# Patient Record
Sex: Male | Born: 1965
Health system: Southern US, Community
[De-identification: ages and names within clinical notes are randomized; demographics above are authoritative.]

## PROBLEM LIST (undated history)

## (undated) DIAGNOSIS — G4730 Sleep apnea, unspecified: Secondary | ICD-10-CM

## (undated) DIAGNOSIS — R519 Headache, unspecified: Secondary | ICD-10-CM

## (undated) HISTORY — PX: CARPAL TUNNEL RELEASE: SHX101

---

## 2020-05-31 DIAGNOSIS — H52223 Regular astigmatism, bilateral: Secondary | ICD-10-CM | POA: Diagnosis not present

## 2020-07-23 DIAGNOSIS — Z20822 Contact with and (suspected) exposure to covid-19: Secondary | ICD-10-CM | POA: Diagnosis not present

## 2020-08-14 ENCOUNTER — Emergency Department (HOSPITAL_BASED_OUTPATIENT_CLINIC_OR_DEPARTMENT_OTHER)
Admission: EM | Admit: 2020-08-14 | Discharge: 2020-08-15 | Disposition: A | Discharge status: Home / Self Care | Payer: BC Managed Care – PPO | Attending: Emergency Medicine | Admitting: Emergency Medicine

## 2020-08-14 ENCOUNTER — Emergency Department (HOSPITAL_BASED_OUTPATIENT_CLINIC_OR_DEPARTMENT_OTHER): Payer: BC Managed Care – PPO

## 2020-08-14 ENCOUNTER — Other Ambulatory Visit: Payer: Self-pay

## 2020-08-14 ENCOUNTER — Encounter (HOSPITAL_BASED_OUTPATIENT_CLINIC_OR_DEPARTMENT_OTHER): Payer: Self-pay | Admitting: *Deleted

## 2020-08-14 DIAGNOSIS — Y9301 Activity, walking, marching and hiking: Secondary | ICD-10-CM | POA: Diagnosis not present

## 2020-08-14 DIAGNOSIS — W010XXA Fall on same level from slipping, tripping and stumbling without subsequent striking against object, initial encounter: Secondary | ICD-10-CM | POA: Diagnosis not present

## 2020-08-14 DIAGNOSIS — S20212A Contusion of left front wall of thorax, initial encounter: Secondary | ICD-10-CM | POA: Insufficient documentation

## 2020-08-14 DIAGNOSIS — S299XXA Unspecified injury of thorax, initial encounter: Secondary | ICD-10-CM | POA: Diagnosis not present

## 2020-08-14 NOTE — ED Triage Notes (Signed)
Fall  From standing x 2 days ago , c/o chest wall pain  Increase pain with movt .

## 2020-08-15 MED ORDER — IBUPROFEN 800 MG PO TABS: 800.0000 mg | ORAL_TABLET | Freq: Four times a day (QID) | ORAL | 0 refills | Status: DC | PRN

## 2020-08-15 MED ORDER — HYDROCODONE-ACETAMINOPHEN 5-325 MG PO TABS: 1.0000 | ORAL_TABLET | ORAL | 0 refills | Status: DC | PRN

## 2020-08-15 NOTE — ED Notes (Signed)
Patient verbalizes understanding of discharge instructions. Opportunity for questioning and answers were provided. Armband removed by staff, pt discharged from ED ambulatory to home.  

## 2020-08-15 NOTE — ED Provider Notes (Signed)
[facility] EMERGENCY DEPARTMENT Provider Note   CSN: [id number] Arrival date & time: 08/14/20  2013     History Chief Complaint  Patient presents with  . Fall    William Rose is a 55 y.o. male.  Patient presents to the emergency department for evaluation of anterior and left-sided chest injury.  Patient fell 2 days ago.  He reports that he tripped over an object while walking and fell to the ground.  Did not hit his head or lose consciousness.  No syncope.  Denies neck and back pain.  He did hit his right knee but is able to walk and bend it without difficulty.  No abdominal pain.  Patient reports constant pain that worsens with bending, moving or taking deep breaths.        History reviewed. No pertinent past medical history.  There are no problems to display for this patient.   Past Surgical History:  Procedure Laterality Date  . CARPAL TUNNEL RELEASE         No family history on file.  Social History   Tobacco Use  . Smoking status: Never Smoker  Substance Use Topics  . Alcohol use: Not Currently  . Drug use: Not Currently    Home Medications Prior to Admission medications   Medication Sig Start Date End Date Taking? Authorizing Provider  HYDROcodone-acetaminophen (NORCO/VICODIN) 5-325 MG tablet Take 1 tablet by mouth every 4 (four) hours as needed for severe pain. 08/15/20  Yes Mylei Brackeen, Canary Brim, MD  ibuprofen (ADVIL) 800 MG tablet Take 1 tablet (800 mg total) by mouth every 6 (six) hours as needed for moderate pain. 08/15/20  Yes Eithan Beagle, Canary Brim, MD    Allergies    Patient has no known allergies.  Review of Systems   Review of Systems  Cardiovascular: Positive for chest pain.  All other systems reviewed and are negative.   Physical Exam Updated Vital Signs BP 125/85 (BP Location: Right Arm)   Pulse 72   Temp 98.5 F (36.9 C)   Resp 20   Ht 5\' 6"  (1.676 m)   Wt (!) 138 kg   SpO2 98%   BMI 49.10 kg/m   Physical  Exam Vitals and nursing note reviewed.  Constitutional:      General: He is not in acute distress.    Appearance: Normal appearance. He is well-developed and well-nourished.  HENT:     Head: Normocephalic and atraumatic.     Right Ear: Hearing normal.     Left Ear: Hearing normal.     Nose: Nose normal.     Mouth/Throat:     Mouth: Oropharynx is clear and moist and mucous membranes are normal.  Eyes:     Extraocular Movements: EOM normal.     Conjunctiva/sclera: Conjunctivae normal.     Pupils: Pupils are equal, round, and reactive to light.  Cardiovascular:     Rate and Rhythm: Regular rhythm.     Heart sounds: S1 normal and S2 normal. No murmur heard. No friction rub. No gallop.   Pulmonary:     Effort: Pulmonary effort is normal. No respiratory distress.     Breath sounds: Normal breath sounds.  Chest:     Chest wall: Tenderness present.    Abdominal:     General: Bowel sounds are normal.     Palpations: Abdomen is soft. There is no hepatosplenomegaly.     Tenderness: There is no abdominal tenderness. There is no guarding or rebound. Negative signs  include Murphy's sign and McBurney's sign.     Hernia: No hernia is present.  Musculoskeletal:        General: Normal range of motion.     Cervical back: Normal range of motion and neck supple.     Right knee: No swelling, deformity, effusion, erythema or ecchymosis. Normal range of motion. Tenderness present.  Skin:    General: Skin is warm, dry and intact.     Findings: No rash.     Nails: There is no cyanosis.  Neurological:     Mental Status: He is alert and oriented to person, place, and time.     GCS: GCS eye subscore is 4. GCS verbal subscore is 5. GCS motor subscore is 6.     Cranial Nerves: No cranial nerve deficit.     Sensory: No sensory deficit.     Coordination: Coordination normal.     Deep Tendon Reflexes: Strength normal.  Psychiatric:        Mood and Affect: Mood and affect normal.        Speech: Speech  normal.        Behavior: Behavior normal.        Thought Content: Thought content normal.     ED Results / Procedures / Treatments   Labs (all labs ordered are listed, but only abnormal results are displayed) Labs Reviewed - No data to display  EKG None  Radiology DG Chest 2 View  Result Date: 08/14/2020 CLINICAL DATA:  Status post fall with chest wall pain. EXAM: CHEST - 2 VIEW COMPARISON:  None. FINDINGS: Decreased lung volumes are seen which is likely secondary to the degree of patient inspiration. Very mild atelectasis is noted within the bilateral lung bases. There is no evidence of a pleural effusion or pneumothorax. The heart size and mediastinal contours are within normal limits. The visualized skeletal structures are unremarkable. IMPRESSION: 1. Low lung volumes with very mild bibasilar atelectasis. Electronically Signed   By: Virgina Norfolk M.D.   On: 08/14/2020 20:46    Procedures Procedures (including critical care time)  Medications Ordered in ED Medications - No data to display  ED Course  I have reviewed the triage vital signs and the nursing notes.  Pertinent labs & imaging results that were available during my care of the patient were reviewed by me and considered in my medical decision making (see chart for details).    MDM Rules/Calculators/A&P                          Left anterior chest discomfort and tenderness after a fall.  Deep palpation in left upper quadrant reveals no abdominal tenderness.  No head injury, axial skeleton injury.  Patient did hit the right knee but examination is unremarkable.  No deformity, no swelling, normal range of motion.  No ligamentous instability.  Patella tendon intact.  X-ray does not show evidence of rib fracture or pulmonary contusion.  Will provide analgesia, given return precautions.  Final Clinical Impression(s) / ED Diagnoses Final diagnoses:  Chest wall contusion, left, initial encounter    Rx / DC Orders ED  Discharge Orders         Ordered    HYDROcodone-acetaminophen (NORCO/VICODIN) 5-325 MG tablet  Every 4 hours PRN        08/15/20 0030    ibuprofen (ADVIL) 800 MG tablet  Every 6 hours PRN        08/15/20 0030  Gilda Crease, MD 08/15/20 0030

## 2020-09-19 ENCOUNTER — Other Ambulatory Visit: Payer: Self-pay

## 2020-09-19 ENCOUNTER — Ambulatory Visit (INDEPENDENT_AMBULATORY_CARE_PROVIDER_SITE_OTHER): Payer: BC Managed Care – PPO | Admitting: Family Medicine

## 2020-09-19 ENCOUNTER — Encounter: Payer: Self-pay | Admitting: Family Medicine

## 2020-09-19 VITALS — BP 130/75 | HR 73 | Temp 98.6°F | Ht 66.54 in | Wt 301.5 lb

## 2020-09-19 DIAGNOSIS — Z13220 Encounter for screening for lipoid disorders: Secondary | ICD-10-CM

## 2020-09-19 DIAGNOSIS — S20219A Contusion of unspecified front wall of thorax, initial encounter: Secondary | ICD-10-CM | POA: Insufficient documentation

## 2020-09-19 DIAGNOSIS — S20219D Contusion of unspecified front wall of thorax, subsequent encounter: Secondary | ICD-10-CM

## 2020-09-19 DIAGNOSIS — Z0000 Encounter for general adult medical examination without abnormal findings: Secondary | ICD-10-CM | POA: Diagnosis not present

## 2020-09-19 NOTE — Progress Notes (Signed)
William Rose - 55 y.o. male MRN [id number]  Date of birth: 1965/12/20  Subjective Chief Complaint  Patient presents with  . Establish Care   Due to language barrier, an interpreter was present during the history-taking and subsequent discussion (and for part of the physical exam) with this patient.   HPI William Rose is a 55 year old male here today for initial visit to establish care.  He and his wife recently moved to this area from [place].  He has been in pretty good health as far as he knows.  He did have a fall a few weeks ago resulting in chest wall contusion.  This has improved.  He reports having colonoscopy previously in [place].  He plans to schedule a physical and have updated labs and vaccinations  ROS:  A comprehensive ROS was completed and negative except as noted per HPI  No Known Allergies  History reviewed. No pertinent past medical history.  Past Surgical History:  Procedure Laterality Date  . CARPAL TUNNEL RELEASE      Social History   Socioeconomic History  . Marital status: Married    Spouse name: Not on file  . Number of children: Not on file  . Years of education: Not on file  . Highest education level: Not on file  Occupational History    Employer: [employer]  Tobacco Use  . Smoking status: Never Smoker  . Smokeless tobacco: Never Used  Vaping Use  . Vaping Use: Never used  Substance and Sexual Activity  . Alcohol use: Not Currently  . Drug use: Not Currently  . Sexual activity: Yes    Partners: Female  Other Topics Concern  . Not on file  Social History Narrative  . Not on file   Social Determinants of Health   Financial Resource Strain: Not on file  Food Insecurity: Not on file  Transportation Needs: Not on file  Physical Activity: Not on file  Stress: Not on file  Social Connections: Not on file    History reviewed. No pertinent family history.  Health Maintenance  Topic Date Due  . Hepatitis C Screening  Never done   . HIV Screening  Never done  . TETANUS/TDAP  Never done  . COLONOSCOPY (Pts 45-21yrs Insurance coverage will need to be confirmed)  Never done  . INFLUENZA VACCINE  11/06/2020 (Originally 03/09/2020)  . COVID-19 Vaccine  Completed     ----------------------------------------------------------------------------------------------------------------------------------------------------------------------------------------------------------------- Physical Exam BP 130/75 (BP Location: Left Arm, Patient Position: Sitting, Cuff Size: Large)   Pulse 73   Temp 98.6 F (37 C) (Oral)   Ht 5' 6.54" (1.69 m)   Wt (!) 301 lb 8 oz (136.8 kg)   SpO2 99%   BMI 47.88 kg/m   Physical Exam Constitutional:      Appearance: Normal appearance.  Neurological:     General: No focal deficit present.     Mental Status: He is alert.  Psychiatric:        Mood and Affect: Mood normal.        Behavior: Behavior normal.     ------------------------------------------------------------------------------------------------------------------------------------------------------------------------------------------------------------------- Assessment and Plan  Chest wall contusion Related to fall in January.  Still has some mild tenderness however has improved significantly since initial injury.  Labs ordered for upcoming annual exam.  He had colonoscopy in [place], he will try to get a copy of these records.  No orders of the defined types were placed in this encounter.   No follow-ups on file.    This  visit occurred during the SARS-CoV-2 public health emergency.  Safety protocols were in place, including screening questions prior to the visit, additional usage of staff PPE, and extensive cleaning of exam room while observing appropriate contact time as indicated for disinfecting solutions.

## 2020-09-19 NOTE — Patient Instructions (Addendum)
Very nice to meet you today! Please have labs completed prior to annual exam.

## 2020-09-19 NOTE — Assessment & Plan Note (Signed)
Related to fall in January.  Still has some mild tenderness however has improved significantly since initial injury.

## 2020-09-22 ENCOUNTER — Encounter: Payer: Self-pay | Admitting: Family Medicine

## 2020-09-26 DIAGNOSIS — Z0000 Encounter for general adult medical examination without abnormal findings: Secondary | ICD-10-CM | POA: Diagnosis not present

## 2020-09-26 DIAGNOSIS — Z13220 Encounter for screening for lipoid disorders: Secondary | ICD-10-CM | POA: Diagnosis not present

## 2020-09-27 LAB — COMPLETE METABOLIC PANEL WITH GFR
AG Ratio: 1.8 (calc) (ref 1.0–2.5)
ALT: 19 U/L (ref 9–46)
AST: 15 U/L (ref 10–35)
Albumin: 4.4 g/dL (ref 3.6–5.1)
Alkaline phosphatase (APISO): 59 U/L (ref 35–144)
BUN: 17 mg/dL (ref 7–25)
CO2: 27 mmol/L (ref 20–32)
Calcium: 9 mg/dL (ref 8.6–10.3)
Chloride: 104 mmol/L (ref 98–110)
Creat: 0.75 mg/dL (ref 0.70–1.33)
GFR, Est African American: 121 mL/min/{1.73_m2} (ref >=60)
GFR, Est Non African American: 105 mL/min/{1.73_m2} (ref >=60)
Globulin: 2.4 g/dL (calc) (ref 1.9–3.7)
Glucose, Bld: 109 mg/dL — ABNORMAL HIGH (ref 65–99)
Potassium: 4.5 mmol/L (ref 3.5–5.3)
Sodium: 139 mmol/L (ref 135–146)
Total Bilirubin: 0.7 mg/dL (ref 0.2–1.2)
Total Protein: 6.8 g/dL (ref 6.1–8.1)

## 2020-09-27 LAB — LIPID PANEL
Cholesterol: 198 mg/dL (ref <200)
HDL: 46 mg/dL (ref >=40)
LDL Cholesterol (Calc): 127 mg/dL (calc) — ABNORMAL HIGH
Non-HDL Cholesterol (Calc): 152 mg/dL (calc) — ABNORMAL HIGH (ref <130)
Total CHOL/HDL Ratio: 4.3 (calc) (ref <5.0)
Triglycerides: 131 mg/dL (ref <150)

## 2020-09-27 LAB — CBC
HCT: 43.4 % (ref 38.5–50.0)
Hemoglobin: 15 g/dL (ref 13.2–17.1)
MCH: 30.2 pg (ref 27.0–33.0)
MCHC: 34.6 g/dL (ref 32.0–36.0)
MCV: 87.5 fL (ref 80.0–100.0)
MPV: 10.5 fL (ref 7.5–12.5)
Platelets: 324 10*3/uL (ref 140–400)
RBC: 4.96 10*6/uL (ref 4.20–5.80)
RDW: 13.1 % (ref 11.0–15.0)
WBC: 8 10*3/uL (ref 3.8–10.8)

## 2020-09-27 LAB — TSH: TSH: 1.67 mIU/L (ref 0.40–4.50)

## 2020-10-10 ENCOUNTER — Encounter: Payer: Self-pay | Admitting: Family Medicine

## 2020-10-10 ENCOUNTER — Ambulatory Visit (INDEPENDENT_AMBULATORY_CARE_PROVIDER_SITE_OTHER): Payer: BC Managed Care – PPO | Admitting: Family Medicine

## 2020-10-10 ENCOUNTER — Other Ambulatory Visit: Payer: Self-pay

## 2020-10-10 VITALS — BP 141/88 | HR 78 | Temp 98.0°F | Ht 66.0 in | Wt 303.1 lb

## 2020-10-10 DIAGNOSIS — Z0000 Encounter for general adult medical examination without abnormal findings: Secondary | ICD-10-CM

## 2020-10-10 DIAGNOSIS — E7800 Pure hypercholesterolemia, unspecified: Secondary | ICD-10-CM

## 2020-10-10 DIAGNOSIS — Z23 Encounter for immunization: Secondary | ICD-10-CM

## 2020-10-10 DIAGNOSIS — E785 Hyperlipidemia, unspecified: Secondary | ICD-10-CM | POA: Insufficient documentation

## 2020-10-10 NOTE — Progress Notes (Signed)
William Rose - 55 y.o. male MRN [id number]  Date of birth: Jul 11, 1966  Subjective Chief Complaint  Patient presents with  . Annual Exam    HPI William Rose is a 55 y.o. male here today for annual exam.  He is still having mild pain from fall last month.  He had labs completed prior to visit today which I reviewed with him.   His glucose was mildly elevated and his LDL cholesterol is elevated. He is not on any long term medications.   He had colonoscopy in [place] in 2019 that was normal.  He was advised to have repeat in 10 years.   He does not exercise very often and feels that his diet could be improved.    He needs update Tdap vaccine.   Review of Systems  Constitutional: Negative for chills, fever, malaise/fatigue and weight loss.  HENT: Negative for congestion, ear pain and sore throat.   Eyes: Negative for blurred vision, double vision and pain.  Respiratory: Negative for cough and shortness of breath.   Cardiovascular: Negative for chest pain and palpitations.  Gastrointestinal: Negative for abdominal pain, blood in stool, constipation, heartburn and nausea.  Genitourinary: Negative for dysuria and urgency.  Musculoskeletal: Negative for joint pain and myalgias.  Neurological: Negative for dizziness and headaches.  Endo/Heme/Allergies: Does not bruise/bleed easily.  Psychiatric/Behavioral: Negative for depression. The patient is not nervous/anxious and does not have insomnia.     No Known Allergies  History reviewed. No pertinent past medical history.  Past Surgical History:  Procedure Laterality Date  . CARPAL TUNNEL RELEASE      Social History   Socioeconomic History  . Marital status: Married    Spouse name: Not on file  . Number of children: Not on file  . Years of education: Not on file  . Highest education level: Not on file  Occupational History    Employer: [employer]  Tobacco Use  . Smoking status: Former Smoker    Packs/day: 1.00     Years: 30.00    Pack years: 30.00    Types: Cigarettes    Quit date: 08/09/2017    Years since quitting: 3.1  . Smokeless tobacco: Never Used  Vaping Use  . Vaping Use: Never used  Substance and Sexual Activity  . Alcohol use: Not Currently  . Drug use: Never  . Sexual activity: Yes    Partners: Female  Other Topics Concern  . Not on file  Social History Narrative  . Not on file   Social Determinants of Health   Financial Resource Strain: Not on file  Food Insecurity: Not on file  Transportation Needs: Not on file  Physical Activity: Not on file  Stress: Not on file  Social Connections: Not on file    History reviewed. No pertinent family history.  Health Maintenance  Topic Date Due  . Hepatitis C Screening  Never done  . HIV Screening  Never done  . COLONOSCOPY (Pts 45-19yrs Insurance coverage will need to be confirmed)  Never done  . INFLUENZA VACCINE  11/06/2020 (Originally 03/09/2020)  . TETANUS/TDAP  10/11/2030  . COVID-19 Vaccine  Completed  . HPV VACCINES  Aged Out     ----------------------------------------------------------------------------------------------------------------------------------------------------------------------------------------------------------------- Physical Exam BP (!) 141/88 (BP Location: Left Arm, Patient Position: Sitting, Cuff Size: Large)   Pulse 78   Temp 98 F (36.7 C)   Ht 5\' 6"  (1.676 m)   Wt (!) 303 lb 1.6 oz (137.5 kg)   SpO2 99%  BMI 48.92 kg/m   Physical Exam Constitutional:      General: He is not in acute distress.    Appearance: He is well-nourished.  HENT:     Head: Normocephalic and atraumatic.     Right Ear: Tympanic membrane and external ear normal.     Left Ear: Tympanic membrane and external ear normal.     Mouth/Throat:     Mouth: Oropharynx is clear and moist. Mucous membranes are moist.  Eyes:     General: No scleral icterus. Neck:     Thyroid: No thyromegaly.  Cardiovascular:     Rate and  Rhythm: Normal rate and regular rhythm.     Pulses: Intact distal pulses.     Heart sounds: Normal heart sounds.  Pulmonary:     Effort: Pulmonary effort is normal.     Breath sounds: Normal breath sounds.  Abdominal:     General: Bowel sounds are normal. There is no distension.     Palpations: Abdomen is soft.     Tenderness: There is no abdominal tenderness. There is no guarding.  Musculoskeletal:        General: No edema.     Cervical back: Normal range of motion.  Lymphadenopathy:     Cervical: No cervical adenopathy.  Skin:    General: Skin is warm and dry.     Findings: No rash.  Neurological:     Mental Status: He is alert and oriented to person, place, and time.     Cranial Nerves: No cranial nerve deficit.     Motor: No abnormal muscle tone.  Psychiatric:        Mood and Affect: Mood and affect and mood normal.        Behavior: Behavior normal.     ------------------------------------------------------------------------------------------------------------------------------------------------------------------------------------------------------------------- Assessment and Plan  Well adult exam Well adult Recent labs reviewed.  Dietary recommendations discussed and handout given.  He is interested in pursuing weight loss surgery, information given on this.  Immunizations:  Tdap Screenings: UTD Anticipatory guidance/Risk factor reduction:  Recommendations per AVS.    No orders of the defined types were placed in this encounter.   Return in about 6 months (around 04/12/2021) for HLD.    This visit occurred during the SARS-CoV-2 public health emergency.  Safety protocols were in place, including screening questions prior to the visit, additional usage of staff PPE, and extensive cleaning of exam room while observing appropriate contact time as indicated for disinfecting solutions.

## 2020-10-10 NOTE — Patient Instructions (Signed)
[facility] Location [address] [place], [place] [address] Business Hours Monday-Friday 8:30am - 5:00pm Contact Office: [phone] Fax: [phone]    [facility] [place] Phone: [phone]  F: [phone] [address] [place], [place] [address]   Dieta para el sndrome metablico Diet for Metabolic Syndrome El sndrome metablico es un trastorno que incluye tres o ms de las siguientes afecciones:  Obesidad abdominal, que se observa en una circunferencia grande de la cintura.  Exceso de azcar (glucosa) en la sangre.  Presin arterial alta (hipertensin arterial).  Una cantidad ms elevada de lo normal de grasa (lpidos) en [employer].  Un nivel por debajo de lo normal de colesterol "bueno" (HDL). Llevar un estilo de Ander Purpura y seguir un plan de dieta saludable puede ayudarlo a manejar su afeccin. Estos cambios tambin pueden ayudar a IT sales professional de afecciones asociadas con el sndrome metablico, como diabetes, enfermedad cardaca y accidente cerebrovascular. Seguir una dieta cardiosaludable puede ayudarlo a disminuir el riesgo de enfermedad cardaca y mejorar el sndrome metablico. Neomia Dear dieta cardiosaludable incluye protenas magras, grasas saludables, frutos secos y muchas frutas y verduras. Junto con la actividad fsica regular, una dieta saludable:  Ayuda a mejorar la forma en que el organismo [place] la [place].  Ayuda a perder peso. Un objetivo comn de las personas con esta afeccin es bajar por lo menos entre el 7 y el 10% o ms del peso inicial. ? Por ejemplo, una persona que comienza con 300lb [phone]) y pierde 30lb (13.6kg) ha perdido el 10% de su peso inicial. Consejos para seguir Consulting civil engineer las etiquetas de los alimentos Verifique la cantidad de sal (sodio) por porcin en las etiquetas de los alimentos. Elija alimentos con menos del 5 por  ciento del valor diario de sodio. Generalmente, los alimentos con menos de 300 miligramos (mg) de sodio por porcin se encuadran dentro de este plan alimentario. Al cocinar  No fra los alimentos. A la hora de cocinar los alimentos opte por hornearlos, hervirlos, grillarlos, asarlos al horno y asarlos a Patent attorney.  Use ms hierbas y especias para agregar sabor a sus comidas en lugar de usar manteca y sal (sodio). Planificacin de las comidas  Considere seguir una dieta a base de vegetales, una dieta mediterrnea o una dieta DASH. Esta dieta incluye gran cantidad de verduras, carnes magras o pescado, cereales integrales, frutas, as como aceites y [address], y bajas cantidades de sodio.  Programe comidas con opciones saludables para la vida diaria. Abastezca su cocina con alimentos saludables para las comidas.  Trate de comer pescado 1 o 2 veces por semana.  Elija una variedad de frutas, verduras y otros alimentos recomendados. Informacin general  Lleve un registro de la cantidad de caloras que ingiere. La ingesta de la cantidad correcta de caloras lo ayudar a Barista un peso saludable.  Incluya la actividad fsica y la alimentacin saludable como parte de su estilo de vida.  Considere la posibilidad de trabajar con un asesor conductual que lo ayude a Personnel officer su objetivo de llevar un estilo de vida saludable, que incluya dieta y ejercicio. Qu alimentos puedo comer? Nils Pyle Bayas. Manzanas. Naranjas. Duraznos. Damascos. Ciruelas. Uvas. Mango. Papaya. [place]. Kiwi. Cerezas. Hoover Brunette Deatra James. Espinaca. Verduras de [employer], que incluyen col rizada, [place], hojas de [place] y de Speed. Guisantes. Remolachas. Coliflor. Repollo. Brcoli. Zanahorias. Judas verdes. Tomates. Calabaza. Christella Noa. Pimientos. Cebollas. Pepinos. Coles de Bruselas. Batatas. ames. Frijoles. Lentejas. Jobe Igo  molido. Pan integral de centeno. Pan, galletitas, tortillas, cereales y pastas  integrales. Avena sin azcar. Trigo burgol. Cebada. Quinua. Arroz integral y arroz salvaje. Carnes y otras protenas Frutos de mar y Oceanographer. Carnes magras. Aves. Tofu. Frutos secos y semillas. Lcteos Productos lcteos sin grasa o con bajo contenido de [place], [place], yogur y [place]. Grasas y Arts development officer. Aceite de canola o de oliva. Frutos secos y Civil engineer, contracting de frutos secos. Semillas. Tahini. [employer]. Leche con bajo contenido de [place]. Productos alternativos a la [place], como [place] de soja o de [place]. Alios y condimentos Ketchup, salsa barbacoa y [place] con [place]. Mostaza. Salsa de pepinillos. Hierbas. Es posible que los productos que se enumeran ms [place] no constituyan una lista completa de los alimentos y las bebidas que puede tomar. Consulte a un nutricionista para obtener ms informacin.   Qu alimentos debo evitar? Nils Pyle Fruta enlatada en almbar liviano o espeso. Verduras Verduras con agregado de cremas o salsas. Granos Granos altamente procesados, como pan blanco, pasta, magdalenas, barras de granola, pretzels y Social worker. Carnes y otras protenas Carne roja. Carne precocinada o curada, como embutidos o panes de carne. Lcteos Leche entera. Yogur con agregado de azcar. Grasas y [address] saturadas, como [place], [place] y aceite de palma. Bebidas Alcohol. Bebidas azucaradas, como t helado y gaseosas. Otros alimentos Comidas fritas. Dulces. Alimentos salados. Es posible que los productos que se enumeran ms [place] no constituyan una lista completa de los alimentos y las bebidas que Personnel officer. Consulte a un nutricionista para obtener ms informacin. Resumen  El sndrome metablico es una combinacin de afecciones como la obesidad abdominal, nivel alto de azcar en la sangre, presin arterial alta y Neomia Dear cantidad ms alta de lpidos en la sangre. El sndrome metablico eleva el riesgo de desarrollar enfermedad cardaca, diabetes y  accidente cerebrovascular.  Seguir una dieta cardiosaludable puede ayudarlo a disminuir el riesgo de enfermedad cardaca y mejorar el sndrome metablico. Esta dieta incluye protenas magras, grasas saludables, frutos secos y muchas frutas y verduras.  Junto con la actividad fsica regular, seguir una dieta saludable puede ayudar al organismo a usar mejor la insulina y [place] a usted a Curator.  Un objetivo comn de las personas con sndrome metablico es perder por lo menos entre el 7 y el 10% del peso inicial. Esta informacin no tiene Theme park manager el consejo del mdico. Asegrese de hacerle al mdico cualquier pregunta que tenga. Document Revised: 01/30/2020 Document Reviewed: 01/30/2020 Elsevier Patient Education  2021 [employer].

## 2020-10-10 NOTE — Assessment & Plan Note (Signed)
Well adult Recent labs reviewed.  Dietary recommendations discussed and handout given.  He is interested in pursuing weight loss surgery, information given on this.  Immunizations:  Tdap Screenings: UTD Anticipatory guidance/Risk factor reduction:  Recommendations per AVS.

## 2020-10-10 NOTE — Assessment & Plan Note (Signed)
Dietary changes recommended.  Repeat lipids in 6 months.

## 2021-02-12 DIAGNOSIS — E8881 Metabolic syndrome: Secondary | ICD-10-CM | POA: Diagnosis not present

## 2021-02-12 DIAGNOSIS — E559 Vitamin D deficiency, unspecified: Secondary | ICD-10-CM | POA: Diagnosis not present

## 2021-02-12 DIAGNOSIS — R5383 Other fatigue: Secondary | ICD-10-CM | POA: Diagnosis not present

## 2021-02-12 DIAGNOSIS — E291 Testicular hypofunction: Secondary | ICD-10-CM | POA: Diagnosis not present

## 2021-02-12 DIAGNOSIS — E611 Iron deficiency: Secondary | ICD-10-CM | POA: Diagnosis not present

## 2021-02-12 DIAGNOSIS — R635 Abnormal weight gain: Secondary | ICD-10-CM | POA: Diagnosis not present

## 2021-02-12 LAB — TSH: TSH: 1.12 (ref 0.41–5.90)

## 2021-02-12 LAB — BASIC METABOLIC PANEL
BUN: 16 (ref 4–21)
CO2: 32 — AB (ref 13–22)
Chloride: 99 (ref 99–108)
Creatinine: 0.9 (ref 0.6–1.3)
Glucose: 91
Potassium: 4.5 (ref 3.4–5.3)
Sodium: 138 (ref 137–147)

## 2021-02-12 LAB — IRON,TIBC AND FERRITIN PANEL
Ferritin: 286
Iron: 59

## 2021-02-12 LAB — VITAMIN D 25 HYDROXY (VIT D DEFICIENCY, FRACTURES): Vit D, 25-Hydroxy: 15.7

## 2021-02-12 LAB — COMPREHENSIVE METABOLIC PANEL
Albumin: 4.6 (ref 3.5–5.0)
Calcium: 9.6 (ref 8.7–10.7)
GFR calc Af Amer: 119
GFR calc non Af Amer: 98

## 2021-02-12 LAB — CBC AND DIFFERENTIAL
HCT: 46 (ref 41–53)
Hemoglobin: 15 (ref 13.5–17.5)
Neutrophils Absolute: 59
Platelets: 423 — AB (ref 150–399)
WBC: 11.4

## 2021-02-12 LAB — CBC: RBC: 5.12 — AB (ref 3.87–5.11)

## 2021-02-12 LAB — HEPATIC FUNCTION PANEL
ALT: 34 (ref 10–40)
AST: 22 (ref 14–40)
Alkaline Phosphatase: 64 (ref 25–125)
Bilirubin, Total: 0.5

## 2021-02-12 LAB — LIPID PANEL
Cholesterol: 156 (ref 0–200)
HDL: 39 (ref 35–70)
LDL Cholesterol: 91
Triglycerides: 130 (ref 40–160)

## 2021-02-12 LAB — PSA: PSA: 0.5

## 2021-02-12 LAB — HEMOGLOBIN A1C: Hemoglobin A1C: 5.4

## 2021-02-19 DIAGNOSIS — E291 Testicular hypofunction: Secondary | ICD-10-CM | POA: Diagnosis not present

## 2021-02-19 DIAGNOSIS — Z1339 Encounter for screening examination for other mental health and behavioral disorders: Secondary | ICD-10-CM | POA: Diagnosis not present

## 2021-02-19 DIAGNOSIS — E611 Iron deficiency: Secondary | ICD-10-CM | POA: Diagnosis not present

## 2021-02-19 DIAGNOSIS — Z1331 Encounter for screening for depression: Secondary | ICD-10-CM | POA: Diagnosis not present

## 2021-02-19 DIAGNOSIS — E8881 Metabolic syndrome: Secondary | ICD-10-CM | POA: Diagnosis not present

## 2021-02-19 DIAGNOSIS — E559 Vitamin D deficiency, unspecified: Secondary | ICD-10-CM | POA: Diagnosis not present

## 2021-02-27 DIAGNOSIS — E8881 Metabolic syndrome: Secondary | ICD-10-CM | POA: Diagnosis not present

## 2021-02-27 DIAGNOSIS — Z6841 Body Mass Index (BMI) 40.0 and over, adult: Secondary | ICD-10-CM | POA: Diagnosis not present

## 2021-02-27 DIAGNOSIS — E291 Testicular hypofunction: Secondary | ICD-10-CM | POA: Diagnosis not present

## 2021-03-06 DIAGNOSIS — E291 Testicular hypofunction: Secondary | ICD-10-CM | POA: Diagnosis not present

## 2021-03-13 DIAGNOSIS — Z6841 Body Mass Index (BMI) 40.0 and over, adult: Secondary | ICD-10-CM | POA: Diagnosis not present

## 2021-03-13 DIAGNOSIS — E611 Iron deficiency: Secondary | ICD-10-CM | POA: Diagnosis not present

## 2021-03-13 DIAGNOSIS — E291 Testicular hypofunction: Secondary | ICD-10-CM | POA: Diagnosis not present

## 2021-03-20 DIAGNOSIS — E291 Testicular hypofunction: Secondary | ICD-10-CM | POA: Diagnosis not present

## 2021-03-27 DIAGNOSIS — Z79890 Hormone replacement therapy (postmenopausal): Secondary | ICD-10-CM | POA: Diagnosis not present

## 2021-03-27 DIAGNOSIS — E291 Testicular hypofunction: Secondary | ICD-10-CM | POA: Diagnosis not present

## 2021-03-27 DIAGNOSIS — R5383 Other fatigue: Secondary | ICD-10-CM | POA: Diagnosis not present

## 2021-03-27 LAB — CBC AND DIFFERENTIAL
HCT: 49 (ref 41–53)
Hemoglobin: 15.8 (ref 13.5–17.5)
Neutrophils Absolute: 50
Platelets: 338 (ref 150–399)
WBC: 10.4

## 2021-03-27 LAB — PSA: PSA: 0.6

## 2021-03-27 LAB — TSH: TSH: 1.19 (ref 0.41–5.90)

## 2021-03-27 LAB — CBC: RBC: 5.46 — AB (ref 3.87–5.11)

## 2021-04-03 DIAGNOSIS — E291 Testicular hypofunction: Secondary | ICD-10-CM | POA: Diagnosis not present

## 2021-04-10 DIAGNOSIS — E291 Testicular hypofunction: Secondary | ICD-10-CM | POA: Diagnosis not present

## 2021-04-17 DIAGNOSIS — R5383 Other fatigue: Secondary | ICD-10-CM | POA: Diagnosis not present

## 2021-04-17 DIAGNOSIS — E291 Testicular hypofunction: Secondary | ICD-10-CM | POA: Diagnosis not present

## 2021-04-17 DIAGNOSIS — R6882 Decreased libido: Secondary | ICD-10-CM | POA: Diagnosis not present

## 2021-04-24 DIAGNOSIS — E291 Testicular hypofunction: Secondary | ICD-10-CM | POA: Diagnosis not present

## 2021-05-08 DIAGNOSIS — Z6841 Body Mass Index (BMI) 40.0 and over, adult: Secondary | ICD-10-CM | POA: Diagnosis not present

## 2021-05-08 DIAGNOSIS — E611 Iron deficiency: Secondary | ICD-10-CM | POA: Diagnosis not present

## 2021-05-08 DIAGNOSIS — E291 Testicular hypofunction: Secondary | ICD-10-CM | POA: Diagnosis not present

## 2021-05-22 DIAGNOSIS — E8881 Metabolic syndrome: Secondary | ICD-10-CM | POA: Diagnosis not present

## 2021-05-22 DIAGNOSIS — Z6841 Body Mass Index (BMI) 40.0 and over, adult: Secondary | ICD-10-CM | POA: Diagnosis not present

## 2021-06-05 DIAGNOSIS — Z6841 Body Mass Index (BMI) 40.0 and over, adult: Secondary | ICD-10-CM | POA: Diagnosis not present

## 2021-06-05 DIAGNOSIS — E559 Vitamin D deficiency, unspecified: Secondary | ICD-10-CM | POA: Diagnosis not present

## 2021-09-16 ENCOUNTER — Encounter: Payer: Self-pay | Admitting: Family Medicine

## 2021-09-16 ENCOUNTER — Ambulatory Visit: Payer: BC Managed Care – PPO | Admitting: Family Medicine

## 2021-09-16 ENCOUNTER — Other Ambulatory Visit: Payer: Self-pay

## 2021-09-16 VITALS — BP 132/84 | HR 81 | Temp 98.1°F | Ht 66.0 in | Wt 310.0 lb

## 2021-09-16 DIAGNOSIS — R6882 Decreased libido: Secondary | ICD-10-CM | POA: Insufficient documentation

## 2021-09-16 DIAGNOSIS — E611 Iron deficiency: Secondary | ICD-10-CM | POA: Diagnosis not present

## 2021-09-16 DIAGNOSIS — G4730 Sleep apnea, unspecified: Secondary | ICD-10-CM | POA: Diagnosis not present

## 2021-09-16 DIAGNOSIS — R7989 Other specified abnormal findings of blood chemistry: Secondary | ICD-10-CM

## 2021-09-16 DIAGNOSIS — G4733 Obstructive sleep apnea (adult) (pediatric): Secondary | ICD-10-CM | POA: Insufficient documentation

## 2021-09-16 DIAGNOSIS — R5383 Other fatigue: Secondary | ICD-10-CM | POA: Insufficient documentation

## 2021-09-16 DIAGNOSIS — Z6841 Body Mass Index (BMI) 40.0 and over, adult: Secondary | ICD-10-CM | POA: Diagnosis not present

## 2021-09-16 MED ORDER — SEMAGLUTIDE-WEIGHT MANAGEMENT 0.25 MG/0.5ML ~~LOC~~ SOAJ: 0.2500 mg | SUBCUTANEOUS | 0 refills | Status: DC

## 2021-09-16 MED ORDER — SEMAGLUTIDE-WEIGHT MANAGEMENT 2.4 MG/0.75ML ~~LOC~~ SOAJ: 2.4000 mg | SUBCUTANEOUS | 1 refills | Status: DC

## 2021-09-16 MED ORDER — SEMAGLUTIDE-WEIGHT MANAGEMENT 1.7 MG/0.75ML ~~LOC~~ SOAJ: 1.7000 mg | SUBCUTANEOUS | 0 refills | Status: DC

## 2021-09-16 MED ORDER — SEMAGLUTIDE-WEIGHT MANAGEMENT 1 MG/0.5ML ~~LOC~~ SOAJ
1.0000 mg | SUBCUTANEOUS | 0 refills | Status: DC
Start: 2021-11-13 — End: 2021-09-30

## 2021-09-16 MED ORDER — SEMAGLUTIDE-WEIGHT MANAGEMENT 0.5 MG/0.5ML ~~LOC~~ SOAJ
0.5000 mg | SUBCUTANEOUS | 0 refills | Status: DC
Start: 2021-10-15 — End: 2021-09-30

## 2021-09-16 NOTE — Assessment & Plan Note (Signed)
We discussed options for weight loss.  We will see if Reginal Lutes is approved on his current formulary.

## 2021-09-16 NOTE — Assessment & Plan Note (Signed)
I did note that his CO2 levels were elevated on his recent lab work completed through [facility].  After further discussion his wife reports that she is noted that he stops breathing while sleeping and snores heavily.  Symptoms are very suspicious for obstructive sleep apnea, referral placed for sleep study.

## 2021-09-16 NOTE — Assessment & Plan Note (Signed)
He had low testosterone on labs completed outside of our clinic.  These were drawn in the afternoon and were not repeated for confirmation.  We will repeat early morning testosterone levels as well as estradiol levels.

## 2021-09-16 NOTE — Assessment & Plan Note (Signed)
Slightly low iron levels on recent labs.  Checking iron and ferritin today.

## 2021-09-16 NOTE — Progress Notes (Signed)
William Rose - 56 y.o. male MRN [id number]  Date of birth: 1965-12-21  Subjective Chief Complaint  Patient presents with   Establish Care   Due to language barrier, an interpreter was present during the history-taking and subsequent discussion (and for part of the physical exam) with this patient.  HPI William Rose is a 56 year old male here for follow-up visit.  Since his last visit with me he did see integrative provider at [facility].  He was initially seen to discuss assistance with weight loss.  He had several labs drawn at his visit.  Labs did show low testosterone in the upper 100s, low vitamin D, low iron level, elevated insulin level.  A1c was 5.4%.  He was prescribed testosterone, anastrozole, vitamin D and Saxenda.  He has discontinued all of these except for anastrozole.  He does not plan to return to [facility].  He does remain interested in medication to help with weight loss.  ROS:  A comprehensive ROS was completed and negative except as noted per HPI  No Known Allergies  No past medical history on file.  Past Surgical History:  Procedure Laterality Date   CARPAL TUNNEL RELEASE      Social History   Socioeconomic History   Marital status: Married    Spouse name: Not on file   Number of children: Not on file   Years of education: Not on file   Highest education level: Not on file  Occupational History    Employer: [employer]  Tobacco Use   Smoking status: Former    Packs/day: 1.00    Years: 30.00    Pack years: 30.00    Types: Cigarettes    Quit date: 08/09/2017    Years since quitting: 4.1   Smokeless tobacco: Never  Vaping Use   Vaping Use: Never used  Substance and Sexual Activity   Alcohol use: Not Currently   Drug use: Never   Sexual activity: Yes    Partners: Female  Other Topics Concern   Not on file  Social History Narrative   Not on file   Social Determinants of Health   Financial Resource Strain: Not on file  Food Insecurity: Not on file   Transportation Needs: Not on file  Physical Activity: Not on file  Stress: Not on file  Social Connections: Not on file    No family history on file.  Health Maintenance  Topic Date Due   HIV Screening  Never done   Hepatitis C Screening  Never done   COVID-19 Vaccine (3 - Booster for Janssen series) 10/02/2021 (Originally 08/30/2020)   INFLUENZA VACCINE  11/06/2021 (Originally 03/09/2021)   Zoster Vaccines- Shingrix (1 of 2) 12/15/2022 (Originally 01/04/2017)   COLONOSCOPY (Pts 45-65yrs Insurance coverage will need to be confirmed)  08/09/2026   TETANUS/TDAP  10/11/2030   HPV VACCINES  Aged Out     ----------------------------------------------------------------------------------------------------------------------------------------------------------------------------------------------------------------- Physical Exam BP 132/84 (BP Location: Left Arm, Patient Position: Sitting, Cuff Size: Large)    Pulse 81    Temp 98.1 F (36.7 C) (Oral)    Ht 5\' 6"  (1.676 m)    Wt (!) 310 lb (140.6 kg)    SpO2 98%    BMI 50.04 kg/m   Physical Exam Constitutional:      Appearance: Normal appearance.  Eyes:     General: No scleral icterus. Cardiovascular:     Rate and Rhythm: Normal rate and regular rhythm.  Pulmonary:     Effort: Pulmonary effort is normal.  Breath sounds: Normal breath sounds.  Musculoskeletal:     Cervical back: Neck supple.  Neurological:     Mental Status: He is alert.  Psychiatric:        Mood and Affect: Mood normal.        Behavior: Behavior normal.    ------------------------------------------------------------------------------------------------------------------------------------------------------------------------------------------------------------------- Assessment and Plan  Observed sleep apnea I did note that his CO2 levels were elevated on his recent lab work completed through [facility].  After further discussion his wife reports that she is noted  that he stops breathing while sleeping and snores heavily.  Symptoms are very suspicious for obstructive sleep apnea, referral placed for sleep study.  Low testosterone in male He had low testosterone on labs completed outside of our clinic.  These were drawn in the afternoon and were not repeated for confirmation.  We will repeat early morning testosterone levels as well as estradiol levels.  Low iron Slightly low iron levels on recent labs.  Checking iron and ferritin today.  Body mass index (BMI) 45.0-49.9, adult (HCC) We discussed options for weight loss.  We will see if Reginal Lutes is approved on his current formulary.   Meds ordered this encounter  Medications   Semaglutide-Weight Management 0.25 MG/0.5ML SOAJ    Sig: Inject 0.25 mg into the skin once a week for 28 days.    Dispense:  2 mL    Refill:  0   Semaglutide-Weight Management 1.7 MG/0.75ML SOAJ    Sig: Inject 1.7 mg into the skin once a week for 28 days.    Dispense:  3 mL    Refill:  0   Semaglutide-Weight Management 2.4 MG/0.75ML SOAJ    Sig: Inject 2.4 mg into the skin once a week for 28 days.    Dispense:  3 mL    Refill:  1   Semaglutide-Weight Management 0.5 MG/0.5ML SOAJ    Sig: Inject 0.5 mg into the skin once a week for 28 days.    Dispense:  2 mL    Refill:  0   Semaglutide-Weight Management 1 MG/0.5ML SOAJ    Sig: Inject 1 mg into the skin once a week for 28 days.    Dispense:  2 mL    Refill:  0    No follow-ups on file.    This visit occurred during the SARS-CoV-2 public health emergency.  Safety protocols were in place, including screening questions prior to the visit, additional usage of staff PPE, and extensive cleaning of exam room while observing appropriate contact time as indicated for disinfecting solutions.

## 2021-09-16 NOTE — Patient Instructions (Addendum)
Start Wegovy for weight loss.  You will inject this once a week.  Increase dose monthly until you reach 2.4 mg per week.   We'll be in touch with lab results.   I have ordered a sleep study to evaluate for sleep apnea.    Apnea del sueo Sleep Apnea La apnea del sueo afecta la respiracin mientras se duerme. Hace que la respiracin se detenga durante 10 segundos o ms tiempo, o se vuelva superficial. Las personas con apnea del sueo suelen Secretary/administrator. Tambin puede aumentar el riesgo de: Infarto de miocardio. Accidente cerebrovascular. Tener mucho sobrepeso (obesidad). Diabetes. Insuficiencia cardaca. Latidos cardacos irregulares. Presin arterial alta. El [place] del tratamiento es ayudarle a respirar normalmente otra vez. Cules son las causas? La causa ms frecuente de esta afeccin es la obstruccin o el colapso de las vas respiratorias. Existen tres tipos de apnea del sueo: Apnea obstructiva del sueo. Esta ocurre cuando las vas respiratorias se obstruyen o colapsan. Apnea central del sueo. Esta ocurre cuando el cerebro no enva las seales correctas a los msculos que controlan la respiracin. Apnea mixta del sueo. Esta es una combinacin de apnea obstructiva y central del sueo. Qu incrementa el riesgo? Tener sobrepeso. Fumar. Tener vas respiratorias pequeas. El envejecimiento. Ser hombre. El consumo de alcohol. Tomar medicamentos para calmarse (sedantes o tranquilizantes). Tener familiares con esta afeccin. Tener la lengua o las amgdalas ms grandes de lo normal. Cules son los signos o sntomas? Dificultad para permanecer dormido. Ronquidos fuertes. Dolor de cabeza por la maana. Despertarse con falta de aliento. Sequedad de boca o dolor de garganta por la maana. Estar somnoliento o cansado Administrator. Si tiene sueo o est cansado [employer], tambin es posible que: No pueda enfocar la mente (concentrarse). Olvide las cosas. Se  enfade mucho y tenga cambios de humor. Se sienta triste (deprimido). Haya cambios en su personalidad. Tenga menos inters en el sexo si es [place]. Sea incapaz de tener una ereccin si es hombre. Cmo se trata?  Dormir de Mudlogger. Usar un medicamento para eliminar la mucosidad de la nariz (descongestivo). Evitar el consumo de alcohol, medicamentos que ayudan a relajarse o ciertos analgsicos (narcticos). Bajar de [place], si es necesario. Cambios en la dieta. Dejar de fumar. Usar una mquina para abrir las vas respiratorias mientras duerme; por ejemplo: Un aparato bucal. Se trata de una boquilla que desplaza la mandbula hacia adelante. Un dispositivo CPAP. Este dispositivo sopla aire a travs de una mscara cuando usted exhala. Un dispositivo EPAP. Este tiene vlvulas que se colocan en cada fosa nasal. Un dispositivo BIPAP. Este dispositivo sopla aire a travs de una mscara cuando usted inhala y exhala. Someterse a Biomedical engineer tratamientos no Comptroller. Siga estas indicaciones en su casa: Estilo de [employer] cambios que le haya recomendado el mdico. Siga una dieta saludable. Baje de peso, si es necesario. Evite el alcohol, los medicamentos para relajarse y Scientific laboratory technician. No fume ni consuma ningn producto que contenga nicotina o tabaco. Si necesita ayuda para dejar de consumir estos productos, consulte al mdico. Indicaciones generales Use los medicamentos de venta libre y los recetados solamente como se lo haya indicado el mdico. Si le proporcionaron una mquina para usar mientras duerme, sela solamente como se lo haya indicado el mdico. Si va a someterse a una ciruga, no olvide informarle al mdico que tiene apnea del sueo. Puede ser necesario que lleve su dispositivo consigo. Concurra a todas las visitas de seguimiento. Comunquese con  un mdico si: El Astronomer para usar mientras duerme le [place] o parece no funcionar. No  se siente mejor. Empeora. Solicite ayuda de inmediato si: Le duele el pecho. Tiene dificultad para inhalar suficiente aire. Tiene molestias en la espalda, en los brazos o en el [place]. Tiene dificultad para hablar. Siente debilidad en un lado del cuerpo. Se le cae un lado de la cara. Estos sntomas pueden Customer service manager. Solicite ayuda de inmediato. Comunquese con el servicio de emergencias de su localidad (911 en los [place]). No espere a ver si los sntomas desaparecen. No conduzca por sus propios medios [employer] [facility]. Resumen Esta afeccin afecta la respiracin durante el sueo. La causa ms frecuente es la obstruccin o el colapso de las vas respiratorias. El [place] del tratamiento es ayudarlo a respirar normalmente mientras duerme. Esta informacin no tiene Theme park manager el consejo del mdico. Asegrese de hacerle al mdico cualquier pregunta que tenga. Document Revised: 04/08/2021 Document Reviewed: 04/08/2021 Elsevier Patient Education  2022 [employer].

## 2021-09-17 ENCOUNTER — Encounter: Payer: Self-pay | Admitting: Family Medicine

## 2021-09-17 LAB — TESTOSTERONE TOTAL,FREE,BIO, MALES
Albumin: 4.3 g/dL (ref 3.6–5.1)
Sex Hormone Binding: 13 nmol/L (ref 10–50)
Testosterone: 109 ng/dL — ABNORMAL LOW (ref 250–827)

## 2021-09-17 LAB — COMPLETE METABOLIC PANEL WITH GFR
AG Ratio: 1.7 (calc) (ref 1.0–2.5)
ALT: 20 U/L (ref 9–46)
AST: 18 U/L (ref 10–35)
Albumin: 4.3 g/dL (ref 3.6–5.1)
Alkaline phosphatase (APISO): 53 U/L (ref 35–144)
BUN: 20 mg/dL (ref 7–25)
CO2: 28 mmol/L (ref 20–32)
Calcium: 9.2 mg/dL (ref 8.6–10.3)
Chloride: 104 mmol/L (ref 98–110)
Creat: 0.81 mg/dL (ref 0.70–1.30)
Globulin: 2.5 g/dL (calc) (ref 1.9–3.7)
Glucose, Bld: 102 mg/dL — ABNORMAL HIGH (ref 65–99)
Potassium: 4.5 mmol/L (ref 3.5–5.3)
Sodium: 139 mmol/L (ref 135–146)
Total Bilirubin: 0.8 mg/dL (ref 0.2–1.2)
Total Protein: 6.8 g/dL (ref 6.1–8.1)
eGFR: 105 mL/min/{1.73_m2} (ref >=60)

## 2021-09-17 LAB — IRON,TIBC AND FERRITIN PANEL
%SAT: 25 % (calc) (ref 20–48)
Ferritin: 194 ng/mL (ref 38–380)
Iron: 76 ug/dL (ref 50–180)
TIBC: 307 mcg/dL (calc) (ref 250–425)

## 2021-09-17 LAB — ESTRADIOL: Estradiol: 20 pg/mL (ref <=39)

## 2021-09-18 ENCOUNTER — Other Ambulatory Visit: Payer: Self-pay | Admitting: Family Medicine

## 2021-09-18 DIAGNOSIS — R6882 Decreased libido: Secondary | ICD-10-CM

## 2021-09-18 DIAGNOSIS — R7989 Other specified abnormal findings of blood chemistry: Secondary | ICD-10-CM

## 2021-09-28 ENCOUNTER — Telehealth: Payer: Self-pay

## 2021-09-28 ENCOUNTER — Encounter: Payer: Self-pay | Admitting: Family Medicine

## 2021-09-28 DIAGNOSIS — R7303 Prediabetes: Secondary | ICD-10-CM

## 2021-09-28 NOTE — Telephone Encounter (Addendum)
Initiated Prior authorization [id number]  Via: Covermymeds Case/Key:[id number] Status: denied as of 09/28/21 Reason:Appeal started, no reason indicated Notified Pt via: Mychart

## 2021-09-29 NOTE — Telephone Encounter (Signed)
Referred to [facility] 09/16/21.  It appears they have been trying to contact him.    Looks like we have been trying to contact him regarding lab results as well.    CM

## 2021-09-30 DIAGNOSIS — R7303 Prediabetes: Secondary | ICD-10-CM | POA: Insufficient documentation

## 2021-09-30 MED ORDER — OZEMPIC (0.25 OR 0.5 MG/DOSE) 2 MG/1.5ML ~~LOC~~ SOPN: PEN_INJECTOR | SUBCUTANEOUS | 1 refills | Status: DC

## 2021-09-30 NOTE — Telephone Encounter (Signed)
Changing to Ozempic.   Thanks!  CM

## 2021-09-30 NOTE — Addendum Note (Signed)
Addended by: Mammie Lorenzo on: 09/30/2021 04:48 PM   Modules accepted: Orders

## 2021-10-19 ENCOUNTER — Other Ambulatory Visit: Payer: Self-pay

## 2021-10-19 ENCOUNTER — Telehealth: Payer: Self-pay

## 2021-10-19 ENCOUNTER — Encounter: Payer: Self-pay | Admitting: Family Medicine

## 2021-10-19 ENCOUNTER — Ambulatory Visit: Payer: BC Managed Care – PPO | Admitting: Family Medicine

## 2021-10-19 VITALS — BP 143/91 | HR 86 | Ht 66.0 in | Wt 308.0 lb

## 2021-10-19 DIAGNOSIS — R7989 Other specified abnormal findings of blood chemistry: Secondary | ICD-10-CM

## 2021-10-19 DIAGNOSIS — Z6841 Body Mass Index (BMI) 40.0 and over, adult: Secondary | ICD-10-CM | POA: Diagnosis not present

## 2021-10-19 DIAGNOSIS — G4730 Sleep apnea, unspecified: Secondary | ICD-10-CM | POA: Diagnosis not present

## 2021-10-19 DIAGNOSIS — R5383 Other fatigue: Secondary | ICD-10-CM | POA: Diagnosis not present

## 2021-10-19 DIAGNOSIS — M25561 Pain in right knee: Secondary | ICD-10-CM

## 2021-10-19 DIAGNOSIS — R7303 Prediabetes: Secondary | ICD-10-CM

## 2021-10-19 DIAGNOSIS — M2550 Pain in unspecified joint: Secondary | ICD-10-CM | POA: Insufficient documentation

## 2021-10-19 DIAGNOSIS — M25562 Pain in left knee: Secondary | ICD-10-CM

## 2021-10-19 MED ORDER — MELOXICAM 15 MG PO TABS: 15.0000 mg | ORAL_TABLET | Freq: Every day | ORAL | 0 refills | Status: DC | PRN

## 2021-10-19 NOTE — Telephone Encounter (Signed)
Advised pt unable to schedule sleep study. Per notes, pt did not answer calls.  ? ?Pt states he would receive the messages after work and return the call but the office was closed.  ? ?Received work phone number for patient. He is requesting they call him at this number to schedule [phone]). ? ?Sent to Referral Coordinator Feaster ?

## 2021-10-19 NOTE — Assessment & Plan Note (Signed)
Meloxicam added as needed. ?

## 2021-10-19 NOTE — Progress Notes (Signed)
?William Rose - 56 y.o. male MRN [id number]  Date of birth: 31-Jan-1966 ? ?Subjective ?Chief Complaint  ?Patient presents with  ? Hypertension  ? ?Due to language barrier, an interpreter was present during the history-taking and subsequent discussion (and for part of the physical exam) with this patient. ? ? ?HPI ?William Rose is a 56 year old male here today for follow-up.  Continues to have difficulty with weight loss as well as sleep.  He was referred to sleep study however he has not scheduled this.  Unfortunately he has been calling number after hours and been unable to schedule.  Prescription written for Wegovy at last visit unfortunately his insurance did not provide any coverage for weight loss medications.  He is obviously quite disappointed by this. ? ?He is having increased pain and bilateral knees and ankles.  He has been trying to exercise little more frequently and this started when he increased his activity.  He has not tried anything for treatment of this so far. ? ?ROS:  A comprehensive ROS was completed and negative except as noted per HPI ? ?No Known Allergies ? ?History reviewed. No pertinent past medical history. ? ?Past Surgical History:  ?Procedure Laterality Date  ? CARPAL TUNNEL RELEASE    ? ? ?Social History  ? ?Socioeconomic History  ? Marital status: Married  ?  Spouse name: Not on file  ? Number of children: Not on file  ? Years of education: Not on file  ? Highest education level: Not on file  ?Occupational History  ?  Employer: [employer]  ?Tobacco Use  ? Smoking status: Former  ?  Packs/day: 1.00  ?  Years: 30.00  ?  Pack years: 30.00  ?  Types: Cigarettes  ?  Quit date: 08/09/2017  ?  Years since quitting: 4.1  ? Smokeless tobacco: Never  ?Vaping Use  ? Vaping Use: Never used  ?Substance and Sexual Activity  ? Alcohol use: Not Currently  ? Drug use: Never  ? Sexual activity: Yes  ?  Partners: Female  ?Other Topics Concern  ? Not on file  ?Social History Narrative  ? Not on file  ? ?Social  Determinants of Health  ? ?Financial Resource Strain: Not on file  ?Food Insecurity: Not on file  ?Transportation Needs: Not on file  ?Physical Activity: Not on file  ?Stress: Not on file  ?Social Connections: Not on file  ? ? ?History reviewed. No pertinent family history. ? ?Health Maintenance  ?Topic Date Due  ? COVID-19 Vaccine (3 - Booster for Janssen series) 11/04/2021 (Originally 08/30/2020)  ? INFLUENZA VACCINE  11/06/2021 (Originally 03/09/2021)  ? Hepatitis C Screening  10/20/2022 (Originally 01/04/1985)  ? HIV Screening  10/20/2022 (Originally 01/04/1982)  ? Zoster Vaccines- Shingrix (1 of 2) 12/15/2022 (Originally 01/04/2017)  ? COLONOSCOPY (Pts 45-78yrs Insurance coverage will need to be confirmed)  08/09/2026  ? TETANUS/TDAP  10/11/2030  ? HPV VACCINES  Aged Out  ? ? ? ?----------------------------------------------------------------------------------------------------------------------------------------------------------------------------------------------------------------- ?Physical Exam ?BP (!) 143/91 (BP Location: Right Arm, Patient Position: Sitting, Cuff Size: Large)   Pulse 86   Ht 5\' 6"  (1.676 m)   Wt (!) 308 lb (139.7 kg)   SpO2 97%   BMI 49.71 kg/m?  ? ?Physical Exam ?Constitutional:   ?   Appearance: Normal appearance.  ?HENT:  ?   Head: Normocephalic and atraumatic.  ?Eyes:  ?   General: No scleral icterus. ?Cardiovascular:  ?   Rate and Rhythm: Normal rate and regular rhythm.  ?Pulmonary:  ?  Effort: Pulmonary effort is normal.  ?   Breath sounds: Normal breath sounds.  ?Musculoskeletal:  ?   Cervical back: Neck supple.  ?Neurological:  ?   General: No focal deficit present.  ?   Mental Status: He is alert.  ?Psychiatric:     ?   Mood and Affect: Mood normal.     ?   Behavior: Behavior normal.   ? ? ?------------------------------------------------------------------------------------------------------------------------------------------------------------------------------------------------------------------- ?Assessment and Plan ? ?Observed sleep apnea ?Given number to schedule sleep study.  He will call this afternoon to schedule. ? ?Body mass index (BMI) 45.0-49.9, adult (HCC) ?I think untreated sleep apnea may be a barrier to his weight loss.  He will look into other insurance options as well to see if he can find something that provides better coverage of weight loss medications. ? ?Joint pain ?Meloxicam added as needed. ? ? ?Meds ordered this encounter  ?Medications  ? meloxicam (MOBIC) 15 MG tablet  ?  Sig: Take 1 tablet (15 mg total) by mouth daily as needed for pain.  ?  Dispense:  30 tablet  ?  Refill:  0  ?  Please label in Spanish.  ? ? ?No follow-ups on file. ? ? ? ?This visit occurred during the SARS-CoV-2 public health emergency.  Safety protocols were in place, including screening questions prior to the visit, additional usage of staff PPE, and extensive cleaning of exam room while observing appropriate contact time as indicated for disinfecting solutions.  ? ?

## 2021-10-19 NOTE — Patient Instructions (Addendum)
[facility]: ?SLEEP MAIN PHONE: [phone] ? ? ?Try meloxicam for joint pain.  ? ? ?Have labs drawn when fasting and before 10am for most accurate levels.   ? ? ?

## 2021-10-19 NOTE — Assessment & Plan Note (Signed)
Given number to schedule sleep study.  He will call this afternoon to schedule. ?

## 2021-10-19 NOTE — Assessment & Plan Note (Addendum)
I think untreated sleep apnea may be a barrier to his weight loss.  He will look into other insurance options as well to see if he can find something that provides better coverage of weight loss medications. ?

## 2021-10-23 DIAGNOSIS — R5383 Other fatigue: Secondary | ICD-10-CM | POA: Diagnosis not present

## 2021-10-23 DIAGNOSIS — R7989 Other specified abnormal findings of blood chemistry: Secondary | ICD-10-CM | POA: Diagnosis not present

## 2021-10-24 ENCOUNTER — Encounter: Payer: Self-pay | Admitting: Family Medicine

## 2021-10-24 LAB — TESTOSTERONE TOTAL,FREE,BIO, MALES
Albumin: 4.3 g/dL (ref 3.6–5.1)
Sex Hormone Binding: 12 nmol/L (ref 10–50)
Testosterone: 154 ng/dL — ABNORMAL LOW (ref 250–827)

## 2021-10-24 LAB — HEMOGLOBIN A1C
Hgb A1c MFr Bld: 5.7 % of total Hgb — ABNORMAL HIGH (ref <5.7)
Mean Plasma Glucose: 117 mg/dL
eAG (mmol/L): 6.5 mmol/L

## 2021-10-26 MED ORDER — OZEMPIC (0.25 OR 0.5 MG/DOSE) 2 MG/1.5ML ~~LOC~~ SOPN
PEN_INJECTOR | SUBCUTANEOUS | 1 refills | Status: DC
Start: 2021-10-26 — End: 2021-12-09

## 2021-10-26 NOTE — Addendum Note (Signed)
Addended by: Mammie Lorenzo on: 10/26/2021 05:20 PM ? ? Modules accepted: Orders ? ?

## 2021-10-31 ENCOUNTER — Encounter: Payer: Self-pay | Admitting: Family Medicine

## 2021-11-03 ENCOUNTER — Telehealth: Payer: Self-pay

## 2021-11-03 NOTE — Telephone Encounter (Signed)
Initiated Prior authorization [id number] (0.25 or 0.5 MG/DOSE) 2MG /1.5ML pen-injectors ?Via: Covermymeds ?Case/Key:[id number] ?Status: approved  as of 11/03/21 ?Reason:Effective from 11/03/2021 through 11/02/2022. ?Notified Pt via: Mychart ?

## 2021-11-27 ENCOUNTER — Ambulatory Visit (INDEPENDENT_AMBULATORY_CARE_PROVIDER_SITE_OTHER): Payer: BC Managed Care – PPO | Admitting: Family Medicine

## 2021-11-27 ENCOUNTER — Other Ambulatory Visit: Payer: Self-pay | Admitting: Family Medicine

## 2021-11-27 VITALS — BP 136/82 | HR 79 | Wt 311.0 lb

## 2021-11-27 DIAGNOSIS — E291 Testicular hypofunction: Secondary | ICD-10-CM

## 2021-11-27 MED ORDER — TESTOSTERONE CYPIONATE 200 MG/ML IM SOLN
200.0000 mg | INTRAMUSCULAR | Status: DC
Start: 2021-11-27 — End: 2022-10-27
  Administered 2021-11-27 – 2022-04-02 (×5): 200 mg via INTRAMUSCULAR

## 2021-11-27 NOTE — Progress Notes (Signed)
Medical screening examination/treatment was performed by qualified clinical staff member and as supervising physician I was immediately available for consultation/collaboration. I have reviewed documentation and agree with assessment and plan.  Rylyn Zawistowski, DO  

## 2021-11-27 NOTE — Progress Notes (Signed)
Pt here for  first testosterone injection.  ? ?Injection tolerated well given in LUOQ.  Pt will RTC in 2 WKS for next injection.   ?

## 2021-11-30 ENCOUNTER — Ambulatory Visit: Payer: BC Managed Care – PPO | Admitting: Family Medicine

## 2021-12-03 ENCOUNTER — Ambulatory Visit: Payer: BC Managed Care – PPO | Admitting: Family Medicine

## 2021-12-03 ENCOUNTER — Encounter: Payer: Self-pay | Admitting: Family Medicine

## 2021-12-03 VITALS — BP 142/93 | HR 88 | Ht 66.0 in | Wt 311.0 lb

## 2021-12-03 DIAGNOSIS — M2550 Pain in unspecified joint: Secondary | ICD-10-CM

## 2021-12-03 DIAGNOSIS — M7660 Achilles tendinitis, unspecified leg: Secondary | ICD-10-CM | POA: Insufficient documentation

## 2021-12-03 DIAGNOSIS — M7661 Achilles tendinitis, right leg: Secondary | ICD-10-CM | POA: Diagnosis not present

## 2021-12-03 MED ORDER — NITROGLYCERIN 0.2 MG/HR TD PT24: MEDICATED_PATCH | TRANSDERMAL | 0 refills | Status: DC

## 2021-12-03 MED ORDER — TRAMADOL HCL 50 MG PO TABS: 50.0000 mg | ORAL_TABLET | Freq: Three times a day (TID) | ORAL | 0 refills | Status: DC | PRN

## 2021-12-03 MED ORDER — KETOROLAC TROMETHAMINE 30 MG/ML IJ SOLN
30.0000 mg | Freq: Once | INTRAMUSCULAR | Status: AC
Start: 2021-12-03 — End: 2021-12-03
  Administered 2021-12-03: 30 mg via INTRAMUSCULAR

## 2021-12-03 MED ORDER — KETOROLAC TROMETHAMINE 30 MG/ML IJ SOLN: 30.0000 mg | Freq: Once | INTRAMUSCULAR | Status: DC

## 2021-12-03 NOTE — Patient Instructions (Addendum)
Apply ice to the painful area 2-3 times per day.  Apply for 15 minutes each time ?Apply 1/4 of nitroglycerin patch to most painful area of achilles daily.  You may have a mild headache from this medication.  Hydrate well and you may take tylenol as needed.  ?You may also use tramadol temporarily to help with acute pain.   ? ? ?Tendinitis aqu?lea ?Achilles Tendinitis ? ?La tendinitis aqu?lea es una inflamaci?n de la banda dura, con aspecto de cord?n, que [employer] m?sculos inferiores de la pierna con el hueso del tal?n (tend?n de Aquiles). Por lo general, esta afecci?n se debe al uso excesivo del tend?n y la articulaci?n del tobillo. ?Por lo general, la tendinitis aqu?lea mejora con el tiempo, con tratamiento y Medical laboratory scientific officer. La recuperaci?n puede llevar semanas o meses. ??Cu?les son las causas? ?Esta afecci?n puede ser causada por lo siguiente: ?Un aumento repentino del ejercicio f?sico o la actividad, como correr. ?La repetici?n de los mismos ejercicios o las mismas actividades, Secondary school teacher. ?No precalentar los m?sculos de las pantorrillas antes de hacer ejercicio. ?Hacer ejercicio con calzado que est? desgastado o no es adecuado para este fin. ?Tener artritis o una formaci?n ?sea (espol?n) en la parte posterior del hueso del tal?n. Este puede rozar contra el tend?n y lesionarlo. ?Desgaste relacionado con la edad. Los tendones se vuelven menos flexibles con la edad y son m?s propensos a lesionarse. ??Cu?les son los signos o s?ntomas? ?Los s?ntomas frecuentes de esta afecci?n incluyen los siguientes: ?Dolor en el tend?n de Aquiles o la parte posterior de la pierna, justo arriba del tal?n. El dolor generalmente empeora con la [place]. ?Rigidez o dolor en la parte posterior de la pierna, en especial por la ma?ana. ?Hinchaz?n de la piel sobre el tend?n de Aquiles. ?Engrosamiento del tend?n. ?Dificultades para pararse en puntas de pie. ??C?mo se diagnostica? ?Esta afecci?n se diagnostica en funci?n de los  s?ntomas y de un examen f?sico. Pueden hacerle estudios, que incluyen los siguientes: ?Radiograf?as. ?Resonancia magn?tica (RM). ??C?mo se trata? ?La finalidad del tratamiento es [employer] s?ntomas y E[place] a curar la lesi?n. El tratamiento puede incluir: ?Disminuir o TEFL teacher las actividades que causaron la tendinitis. Esto significa pasar a realizar ejercicios de bajo impacto, como ciclismo o nataci?n. ?Aplicar hielo en la zona de la lesi?n. ?Hacer fisioterapia, que incluye ejercicios de elongaci?n y fortalecimiento. ?Tomar antiinflamatorios no esteroideos (AINE), como ibuprofeno, para ayudar a Engineer, materials y la hinchaz?n. ?Usar calzado c?modo y con cierta elevaci?n en la parte del tal?n; vendajes; o una bota para caminar (f?rula tipo bota). ?Someterse a Dealer. Esta se puede realizar si sus s?ntomas no mejoran despu?s de otros tratamientos. ?Tratamiento con ondas de choque extracorp?reas para estimular el proceso de curaci?n. Esto es poco frecuente. ?Aplicarse una inyecci?n de medicamentos que ayudan a Paramedic la inflamaci?n (corticoesteroides). Esto es poco frecuente. ?Siga estas instrucciones en su casa: ?Si tiene una f?rula tipo bota: ??sela como se lo haya indicado el m?dico. Qu?tesela solamente como se lo haya indicado el m?dico. ?Afl?jela si siente hormigueo en los dedos de los pies, si se le entumecen, o se le enfr?an y se tornan de Research officer, trade union. ?Mant?ngala limpia. ?Si la f?rula tipo bota no es impermeable: ?No deje que se moje. ?C?brala con un envoltorio herm?tico cuando tome un ba?o de inmersi?n o una ducha. ?Control del dolor, la rigidez y la hinchaz?n ? ?Si se lo indican, aplique hielo sobre la zona de la lesi?n. Para hacer esto: ?Si tiene  una f?rula tipo bota extra?ble, qu?tesela como se lo haya indicado el m?dico. ?Ponga el hielo en una bolsa pl?stica. ?Coloque una [employer] piel y Copy. ?Aplique el hielo durante 20 minutos, 2 a 3 veces por d?a. ?Mueva los dedos del pie con  frecuencia para reducir la rigidez y la hinchaz?n. ?Cuando est? sentado o acostado, levante (eleve) el pie por encima del nivel del coraz?n. ?Actividad ?Retome sus actividades normales de a poco seg?n lo indicado por el m?dico. Preg?ntele al m?dico qu? actividades son seguras para usted. ?No realice ninguna actividad que le cause dolor. ?Considere realizar ejercicios de bajo impacto, como ciclismo o nataci?n. ?Preg?ntele al m?dico cu?ndo puede volver a conducir si tiene una f?rula tipo bota en el pie. ?Si le indican fisioterapia, haga los ejercicios como se lo haya indicado el m?dico o el fisioterapeuta. ?Indicaciones generales ?Si as? se lo indican, envuelva el pie con una venda el?stica u otro tipo de vendaje. Esto puede ayudar a Financial planner excesivo del tend?n [place] se recupera. Su m?dico le mostrar? c?mo vendarse correctamente el pie. ?Use plantillas o dispositivos que le eleven levemente el tal?n solo como se lo haya indicado el m?dico. ?Use los medicamentos de venta libre y los recetados solamente como se lo haya indicado el m?dico. ?Concurra a todas las visitas de seguimiento como se lo haya indicado el m?dico. Esto es importante. ?Comun?quese con un m?dico si: ?Tiene s?ntomas que empeoran. ?Tiene dolor que no mejora con medicamentos. ?Tiene s?ntomas nuevos e inexplicables. ?Siente calor e hinchaz?n en el pie. ?Tiene fiebre. ?Solicite ayuda inmediatamente si: ?Siente un chasquido repentino o escucha un ruido similar a un chasquido en el tend?n de Aquiles seguido de un dolor intenso. ?No puede mover el pie o los dedos del pie. ?No puede apoyar el peso del cuerpo [employer]. ?El pie o los dedos del pie se le adormecen y se ven blancos o azules, incluso despu?s de aflojar el vendaje o la f?rula tipo bota. ?Resumen ?La tendinitis aqu?lea es una inflamaci?n de la banda dura, con aspecto de cord?n, que [employer] m?sculos inferiores de la pierna con el hueso del tal?n (tend?n de Aquiles). ?Por lo  general, esta afecci?n se debe al uso excesivo del tend?n y la articulaci?n del tobillo. Tambi?n puede estar causada por la artritis o envejecimiento normal. ?Los s?ntomas m?s comunes de esta afecci?n incluyen dolor, hinchaz?n o rigidez en el tend?n de [place] o en la parte posterior de la pierna. ?Por lo general, esta afecci?n se trata disminuyendo o dejando de hacer las actividades que causaron la tendinitis, aplicando hielo en la zona lesionada, tomando antiinflamatorios no esteroideos (AINE) y haciendo fisioterapia. ?Esta informaci?n no tiene Theme park manager el consejo del m?dico. Aseg?rese de hacerle al m?dico cualquier pregunta que tenga. ?Document Revised: 01/31/2019 Document Reviewed: 01/31/2019 ? ?Rehabilitaci?n de la tendinitis de Aquiles ?Achilles Tendinitis [facility] ?Pregunte al m?dico qu? ejercicios son seguros para usted. Haga los ejercicios exactamente como se lo haya indicado el m?dico y grad?elos como se lo hayan indicado. Es normal sentir un leve estiramiento, tironeo, opresi?n o Dentist al [employer] ejercicios. Det?ngase de inmediato si siente un dolor repentino o el dolor empeora. No comience a hacer estos ejercicios hasta que se lo indique el m?dico. ?Ejercicios de elongaci?n y amplitud de movimiento ?Estos ejercicios calientan los m?sculos y las articulaciones, y mejoran el movimiento y la flexibilidad del tobillo. Adem?s, ayudan a Engineer, materials. ?De pie frente a la pared, estiramiento de pantorrilla con  la rodilla extendida ? ?P?rese con las manos General Dynamicsapoyadas sobre la pared. ?Extienda la pierna izquierda/derecha hacia atr?s y flexione ligeramente la rodilla de la pierna de adelante. Mantenga ambos talones apoyados en el suelo. ?Los dedos del pie de atr?s deben apuntar ligeramente hacia adentro. ?Mantenga los talones apoyados en el suelo y la rodilla de atr?s extendida, y lleve el peso hacia la pared. No arquee la espalda. Debe sentir un ligero estiramiento en la parte superior de la  pantorrilla. ?Mantenga esta posici?n durante __________ segundos. ?Repita __________ veces. Realice este ejercicio __________ veces al d?a. ?De pie frente a la pared, estiramiento de pantorrilla con la rodilla flexion

## 2021-12-03 NOTE — Assessment & Plan Note (Signed)
Injection of toradol given today.  Given heel lift for shoe.  Discussed eccentric exercises and given handout.  Start NTG 0.2mg , 1/4 patch daily to most painful area.  Warned about potential for headache.  Given a few tramadol for acute pain for now and written out of work for a few days.   ?

## 2021-12-03 NOTE — Progress Notes (Signed)
?William Rose - 56 y.o. male MRN [id number]  Date of birth: 1966-07-02 ? ?Subjective ?Chief Complaint  ?Patient presents with  ? Foot Injury  ? ? ?HPI ?William Rose is a 56 y.o. male here today with complaint of worsening achilles pain on the R side.  He has increased his time at the gym recently and is also walking quite a bit at work.  He has tried meloxicam without relief.  He has had some swelling around this area as well.  ? ?ROS:  A comprehensive ROS was completed and negative except as noted per HPI ? ?No Known Allergies ? ?History reviewed. No pertinent past medical history. ? ?Past Surgical History:  ?Procedure Laterality Date  ? CARPAL TUNNEL RELEASE    ? ? ?Social History  ? ?Socioeconomic History  ? Marital status: Married  ?  Spouse name: Not on file  ? Number of children: Not on file  ? Years of education: Not on file  ? Highest education level: Not on file  ?Occupational History  ?  Employer: [employer]  ?Tobacco Use  ? Smoking status: Former  ?  Packs/day: 1.00  ?  Years: 30.00  ?  Pack years: 30.00  ?  Types: Cigarettes  ?  Quit date: 08/09/2017  ?  Years since quitting: 4.3  ? Smokeless tobacco: Never  ?Vaping Use  ? Vaping Use: Never used  ?Substance and Sexual Activity  ? Alcohol use: Not Currently  ? Drug use: Never  ? Sexual activity: Yes  ?  Partners: Female  ?Other Topics Concern  ? Not on file  ?Social History Narrative  ? Not on file  ? ?Social Determinants of Health  ? ?Financial Resource Strain: Not on file  ?Food Insecurity: Not on file  ?Transportation Needs: Not on file  ?Physical Activity: Not on file  ?Stress: Not on file  ?Social Connections: Not on file  ? ? ?History reviewed. No pertinent family history. ? ?Health Maintenance  ?Topic Date Due  ? COVID-19 Vaccine (3 - Booster for Janssen series) 04/09/2022 (Originally 08/30/2020)  ? Hepatitis C Screening  10/20/2022 (Originally 01/04/1985)  ? HIV Screening  10/20/2022 (Originally 01/04/1982)  ? Zoster Vaccines- Shingrix (1  of 2) 12/15/2022 (Originally 01/04/2017)  ? INFLUENZA VACCINE  03/09/2022  ? COLONOSCOPY (Pts 45-57yrs Insurance coverage will need to be confirmed)  08/09/2026  ? TETANUS/TDAP  10/11/2030  ? HPV VACCINES  Aged Out  ? ? ? ?----------------------------------------------------------------------------------------------------------------------------------------------------------------------------------------------------------------- ?Physical Exam ?BP (!) 142/93 (BP Location: Left Arm, Patient Position: Sitting, Cuff Size: Large)   Pulse 88   Ht 5\' 6"  (1.676 m)   Wt (!) 311 lb (141.1 kg)   SpO2 97%   BMI 50.20 kg/m?  ? ?Physical Exam ?Constitutional:   ?   Appearance: Normal appearance.  ?Musculoskeletal:  ?   Comments: TTP along the mid portion of the achilles.  Mild swelling noted.  Distal achilles is not significantly tender.  No haglund deformity noted.    ?Neurological:  ?   Mental Status: He is alert.  ? ? ?------------------------------------------------------------------------------------------------------------------------------------------------------------------------------------------------------------------- ?Assessment and Plan ? ?Achilles tendinitis ?Injection of toradol given today.  Given heel lift for shoe.  Discussed eccentric exercises and given handout.  Start NTG 0.2mg , 1/4 patch daily to most painful area.  Warned about potential for headache.  Given a few tramadol for acute pain for now and written out of work for a few days.   ? ? ?Meds ordered this encounter  ?Medications  ? nitroGLYCERIN (  NITRODUR - DOSED IN MG/24 HR) 0.2 mg/hr patch  ?  Sig: Apply 1/4 of patch daily to most painful area of achilles.  ?  Dispense:  30 patch  ?  Refill:  0  ? traMADol (ULTRAM) 50 MG tablet  ?  Sig: Take 1 tablet (50 mg total) by mouth every 8 (eight) hours as needed for up to 5 days.  ?  Dispense:  15 tablet  ?  Refill:  0  ? DISCONTD: ketorolac (TORADOL) 30 MG/ML injection 30 mg  ? ketorolac (TORADOL) 30  MG/ML injection 30 mg  ? ? ?No follow-ups on file. ? ? ? ?This visit occurred during the SARS-CoV-2 public health emergency.  Safety protocols were in place, including screening questions prior to the visit, additional usage of staff PPE, and extensive cleaning of exam room while observing appropriate contact time as indicated for disinfecting solutions.  ? ?

## 2021-12-07 ENCOUNTER — Encounter: Payer: Self-pay | Admitting: Neurology

## 2021-12-07 ENCOUNTER — Ambulatory Visit: Payer: BC Managed Care – PPO | Admitting: Neurology

## 2021-12-07 VITALS — BP 136/90 | HR 91 | Ht 68.11 in | Wt 312.5 lb

## 2021-12-07 DIAGNOSIS — K08109 Complete loss of teeth, unspecified cause, unspecified class: Secondary | ICD-10-CM | POA: Insufficient documentation

## 2021-12-07 DIAGNOSIS — R0683 Snoring: Secondary | ICD-10-CM | POA: Diagnosis not present

## 2021-12-07 DIAGNOSIS — G4719 Other hypersomnia: Secondary | ICD-10-CM | POA: Diagnosis not present

## 2021-12-07 DIAGNOSIS — G478 Other sleep disorders: Secondary | ICD-10-CM

## 2021-12-07 DIAGNOSIS — R7303 Prediabetes: Secondary | ICD-10-CM

## 2021-12-07 DIAGNOSIS — E662 Morbid (severe) obesity with alveolar hypoventilation: Secondary | ICD-10-CM

## 2021-12-07 DIAGNOSIS — Z6841 Body Mass Index (BMI) 40.0 and over, adult: Secondary | ICD-10-CM

## 2021-12-07 NOTE — Progress Notes (Signed)
? ? ?[facility] ?  ? ?Provider:  Melvyn Novas, MD  ?Primary Care Physician:  Everrett Coombe, DO ?[address]  [address] ?[place] [address]  ? ?  ?Referring Provider: Everrett Coombe, Do ?[address]  ?[address] ?[place],  [place] [address]  ?  ?  ?    ?Chief Complaint according to patient   ?Patient presents with:  ?  ? New Patient (Initial Visit)  ?     ?  ?  ?HISTORY OF PRESENT ILLNESS:  ?William Rose is a 56 y.o. year old Spaniard seen here as a referral on 12/07/2021 from PCP.  ?Chief concern according to patient :  Rm 1, w interpreter. Pt referred by Everrett Coombe, DO. for loud snoring, and witnessed sleep apnea by spouse  ?Last SS over 5 years ago, normal workup-he was a smoker at that time. He had the study while in [place].  ? Pt noticed no change in symptoms since then, but gained weight since he quit smoking .  ?Wakes up with dry mouth some nights. Average 6 hrs of sleep.  ?Use to drink 16 small C of coffee now drinks about 8 C.  ?  ?Waylon Hershey Reigel  has no past medical history on file. ?  ?  ?Sleep relevant medical history: Nocturia once, low spine surgery.  ? Family medical /sleep history: No other family member on CPAP with OSA.  ?  ?Social history:  Patient is working in [employer],  Special educational needs teacher, and and lives in a household with spouse,  4 children are grown.  ?The patient currently works/ used to work in shifts( rotating,) 4 early and 4 late.  ?Tobacco use- quit 4 years ago.  ?ETOH use : quit ,  ?Caffeine intake in form of Coffee( 8 cups a day). ?Regular exercise: none .   ?  ?Sleep habits are as follows: The patient's dinner time is between 8 PM. The patient goes to bed at 9 PM and continues to sleep for 5-6 hours, wakes for one bathroom breaks. ?The preferred sleep position is prone, with the support of 2 pillows.  ?Dreams are reportedly frequent/vivid.  ?3.30 for early shift 3.30  AM is the usual rise time.  ?The patient wakes up  spontaneously before an alarm.  ?He reports not feeling refreshed or restored in AM, with symptoms such as dry mouth, and residual fatigue. Naps are taken infrequently.  ?  ?Review of Systems: ?Out of a complete 14 system review, the patient complains of only the following symptoms, and all other reviewed systems are negative.:  ?Fatigue, sleepiness , snoring,   ?How likely are you to doze in the following situations: ?0 = not likely, 1 = slight chance, 2 = moderate chance, 3 = high chance ?  ?Sitting and Reading? ?Watching Television? ?Sitting inactive in a public place (theater or meeting)? ?As a passenger in a car for an hour without a break? ?Lying down in the afternoon when circumstances permit? ?Sitting and talking to someone? ?Sitting quietly after lunch without alcohol? ?In a car, while stopped for a few minutes in traffic? ?  ?Total = 12/ 24 points  ? FSS endorsed at 28/ 63 points.  ? ?Smoked since age 77 .  ? ?Social History  ? ?Socioeconomic History  ? Marital status: Married  ?  Spouse name: Debby Bud  ? Number of children: 4  ? Years of education: Not on file  ? Highest  education level: Some college, no degree  ?Occupational History  ?  Employer: [employer]  ?Tobacco Use  ? Smoking status: Former  ?  Packs/day: 1.00  ?  Years: 30.00  ?  Pack years: 30.00  ?  Types: Cigarettes  ?  Quit date: 08/09/2017  ?  Years since quitting: 4.3  ? Smokeless tobacco: Never  ?Vaping Use  ? Vaping Use: Never used  ?Substance and Sexual Activity  ? Alcohol use: Never  ? Drug use: Never  ? Sexual activity: Yes  ?  Partners: Female  ?Other Topics Concern  ? Not on file  ?Social History Narrative  ? Lives with wife  ? R handed  ? Caffeine: use to drink 16 C of coffee a day and now 8 coffee a day  ? ?Social Determinants of Health  ? ?Financial Resource Strain: Not on file  ?Food Insecurity: Not on file  ?Transportation Needs: Not on file  ?Physical Activity: Not on file  ?Stress: Not on file  ?Social Connections: Not on file   ? ? ?Family History  ?Problem Relation Age of Onset  ? Epilepsy Mother   ? ? ?History reviewed. No pertinent past medical history. ? ?Past Surgical History:  ?Procedure Laterality Date  ? CARPAL TUNNEL RELEASE    ?  ? ?Current Outpatient Medications on File Prior to Visit  ?Medication Sig Dispense Refill  ? anastrozole (ARIMIDEX) 1 MG tablet     ? nitroGLYCERIN (NITRODUR - DOSED IN MG/24 HR) 0.2 mg/hr patch Apply 1/4 of patch daily to most painful area of achilles. 30 patch 0  ? Semaglutide,0.25 or 0.5MG /DOS, (OZEMPIC, 0.25 OR 0.5 MG/DOSE,) 2 MG/1.5ML SOPN Take 0.25mg  weekly x1 month then increase to 0.5mg  weekly. 3 mL 1  ? traMADol (ULTRAM) 50 MG tablet Take 1 tablet (50 mg total) by mouth every 8 (eight) hours as needed for up to 5 days. 15 tablet 0  ? ?Current Facility-Administered Medications on File Prior to Visit  ?Medication Dose Route Frequency Provider Last Rate Last Admin  ? testosterone cypionate (DEPOTESTOSTERONE CYPIONATE) injection 200 mg  200 mg Intramuscular Q14 Days Everrett Coombe, DO   200 mg at 11/27/21 1050  ? ? ?No Known Allergies ? ?Physical exam: ? ?Today's Vitals  ? 12/07/21 1248  ?BP: 136/90  ?Pulse: 91  ?Weight: (!) 312 lb 8 oz (141.7 kg)  ?Height: 5' 8.11" (1.73 m)  ? ?Body mass index is 47.36 kg/m?.  ? ?Wt Readings from Last 3 Encounters:  ?12/07/21 (!) 312 lb 8 oz (141.7 kg)  ?12/03/21 (!) 311 lb (141.1 kg)  ?11/27/21 (!) 311 lb (141.1 kg)  ?  ? ?Ht Readings from Last 3 Encounters:  ?12/07/21 5' 8.11" (1.73 m)  ?12/03/21 5\' 6"  (1.676 m)  ?10/19/21 5\' 6"  (1.676 m)  ?  ?  ?General: The patient is awake, alert and appears not in acute distress. The patient is well groomed. ?Head: Normocephalic, atraumatic. Neck is supple.  ?Mallampati 3,  ?neck circumference:19 inches/ 47 cm . Nasal airflow barely patent.  Retrognathia is not seen.  ?Dental status: edentulous  ?Cardiovascular:  Regular rate and cardiac rhythm by pulse,  without distended neck veins. ?Respiratory: Lungs are clear to  auscultation.  ?Skin:  Without evidence of ankle edema, or rash. ?Trunk: The patient's posture is slouched ?  ?Neurologic exam : ?The patient is awake and alert, oriented to place and time.   ?Memory subjective described as intact.  ?Attention span & concentration ability appears normal.  ?Speech  is fluent,  without  dysarthria, dysphonia or aphasia.  ?Mood and affect are appropriate. ?  ?Cranial nerves: no loss of smell or taste reported  ?Pupils are equal and briskly reactive to light. Funduscopic exam deferred.Marland Kitchen.  ?Extraocular movements in vertical and horizontal planes were intact and without nystagmus. No Diplopia. ?Visual fields by finger perimetry are intact. ?Hearing was intact to soft voice and finger rubbing.   ? Facial sensation intact to fine touch. ? Facial motor strength is symmetric and tongue and uvula move midline.  ?Neck ROM : rotation, tilt and flexion extension were normal for age and shoulder shrug was symmetrical.  ?  ?Motor exam:  Symmetric bulk, tone and ROM.   ?Normal tone without cog- wheeling, symmetric grip strength . ?  ?Sensory:  Fine touch, pinprick and vibration were tested  and  normal.  ?Proprioception tested in the upper extremities was normal. ?  ?Coordination: Rapid alternating movements in the fingers/hands were of normal speed.  ?The Finger-to-nose maneuver was intact without evidence of ataxia, dysmetria or tremor. ?  ?Gait and station: Patient could rise unassisted from a seated position, walked without assistive device.  ?Stance is of wide base , slightly stooped.   ?Toe and heel walk were deferred.  ?Deep tendon reflexes: in the  upper and lower extremities are symmetric and intact. Brisk patella.  ?Babinski response was deferred . ?  ?  ?  ?After spending a total time of  35  minutes face to face and additional time for physical and neurologic examination, review of laboratory studies,  ?personal review of imaging studies, reports and results of other testing and review of  referral information / records as far as provided in visit, I have established the following assessments: ?0) excessive daytime sleepiness.  ?1) spouse has reported snoring and apnea, since over a decade ago- he quit smoki

## 2021-12-07 NOTE — Patient Instructions (Signed)
S?ndrome de hipoventilaci?n y obesidad ?Obesity-Hypoventilation Syndrome ?El s?ndrome de hipoventilaci?n y obesidad (SHO) es una afecci?n que hace que una persona no pueda dejar que el aire entre y salga de los pulmones de forma eficiente (ventilar). Esta afecci?n ocasiona una acumulaci?n de niveles de di?xido de carbonoen la sangre y [place] disminuci?n de los niveles de ox?geno. ?El SHO puede aumentar el riesgo de: ?Cardiopat?a pulmonar o insuficiencia card?aca del lado derecho. ?Insuficiencia en el lado izquierdo del coraz?n. ?Hipertensi?n pulmonar, o presi?n arterial elevada en las arterias de los pulmones. ?Exceso de gl?bulos rojos en el cuerpo. ?Discapacidad o muerte. ??Cu?les son las causas? ?Esta afecci?n puede ser causada por lo siguiente: ?Ser obeso con un IMC (?ndice de masa corporal) mayor o igual que 30 kg/m2. ?Tener mucha grasa alrededor del abdomen, el pecho y el cuello. ?Incapacidad del cerebro para controlar adecuadamente el nivel alto de di?xido de carbono y el nivel bajo de ox?geno. ?Hormonas producidas por las c?lulas adiposas. Estas hormonas pueden interferir con la respiraci?n. ?Apnea del sue?o. Ocurre cuando la respiraci?n se detiene, se pausa o es superficial durante el sue?o. ??Cu?les son los signos o s?ntomas? ?Los s?ntomas de esta afecci?n incluyen: ?Sentirse somnoliento durante el d?a. ?Dolores de [place]. Estos pueden ser peores por la ma?ana. ?Falta de aire. ?Ronquidos, ahogos, [employer] o dificultad para respirar al dormir. ?Falta de concentraci?n o [employer]. ?Cambios en el estado de ?nimo o sensaci?n de irritabilidad. ?Depresi?n. ??C?mo se diagnostica? ?Este trastorno puede diagnosticarse mediante: ?La medici?n del ?ndice de masa muscular [facility]). ?An?lisis de sangre para [employer] de bicarbonato s?rico, di?xido de carbono y ox?geno. ?Oximetr?a de pulso para medir la cantidad de ox?geno en la sangre. Se utiliza un aparato peque?o que se coloca en un dedo de la mano o del pie o en el  l?bulo de la [place]. ?Polisomnograf?a, o estudio del sue?o, para controlar los patrones de respiraci?n y [place] de ox?geno y di?xido de carbono mientras duerme. ?Tambi?n pueden hacerle otras pruebas, incluidas las siguientes: ?Una radiograf?a de t?rax para descartar otros problemas respiratorios. ?Pruebas de los pulmones, o pruebas del funcionamiento pulmonar, para descartar otros problemas respiratorios. ?Un ECG (electrocardiograma) o un ecocardiograma para detectar signos de insuficiencia card?aca. ??C?mo se trata? ?El tratamiento de esta afecci?n puede incluir: ?Un dispositivo como un aparato de presi?n positiva continua (CPAP) o de bipresi?n positiva en las v?as a?reas (BIPAP) Estos dispositivos administran presi?n y, a veces, ox?geno para facilitar la respiraci?n. Pueden ponerle una m?scara sobre la nariz o la boca. ?Ox?geno si su nivel de ox?geno en la sangre es bajo. ?Un programa para bajar de [place]. ?Cirug?a bari?trica o para favorecer la p?rdida de peso. ?[place]?a. Se coloca un tubo en la tr?quea a trav?s del cuello para ayudar a respirar. ?Siga estas indicaciones en su casa: ? ?Medicamentos ?Use los medicamentos de venta libre y los recetados solamente como se lo haya indicado el m?dico. ?Preg?ntele al m?dico qu? medicamentos son seguros para usted. Es posible que le indiquen que evite los medicamentos como los sedantes y narc?ticos. Estos pueden afectar la respiraci?n y Medical illustrator. ?H?bitos de sue?o ?Si le indicaron que use un aparato de CPAP o BIPAP, aseg?rese de entender c?mo usarlo. Use el aparato de CPAP o BIPAP solamente como se lo haya indicado el m?dico. ?Trate de dormir al menos 8 horas todas las noches. ?Comida y bebida ? ?Consumir alimentos ricos en fibra, como frijoles, cereales integrales, y frutas y verduras frescas. ?Limite el consumo de alimentos ricos en grasa y  az?cares procesados, como los alimentos fritos o dulces. ?Beba suficiente l?quido como para mantener la orina de  color [place] p?lido. ?No beba alcohol si: ?Su m?dico le indica no hacerlo. ?Est? embarazada, puede estar embarazada o est? tratando de quedar embarazada. ?Indicaciones generales ?Siga una dieta y un plan de actividad f?sica que le ayuden a Barista y Pharmacologist un peso saludable como se lo haya indicado el m?dico. ?Haga actividad f?sica habitualmente como se lo haya indicado el m?dico. ?No consuma ning?n producto que contenga nicotina o tabaco. Estos productos incluyen cigarrillos, tabaco para mascar y aparatos de vapeo, como los cigarrillos electr?nicos. Si necesita ayuda para dejar de consumir estos productos, consulte al m?dico. ?Concurra a todas las visitas de seguimiento. Esto es importante. ?Comun?quese con un m?dico si: ?Comienza a faltarle el aire o la falta de aire empeora. ?Tiene dificultad para despertarse o mantenerse despierto. ?Se siente confundido. ?Tiene tos. ?Tiene fiebre. ?Solicite ayuda de inmediato si: ?Siente dolor en el pecho. ?Tiene latidos card?acos irregulares o r?pidos. ?Se siente mareado o se desmaya. ?Tiene s?ntomas de un accidente cerebrovascular. ?BE FAST? es una manera f?cil de recordar los principales signos de advertencia de un accidente cerebrovascular: ?B: Balance (equilibrio). Los signos son mareos, dificultad repentina para caminar o p?rdida del equilibrio. ?E: Eyes (ojos). Los signos son problemas para ver o un cambio repentino en la visi?n. ?F: Face (rostro). Los signos son debilidad repentina o adormecimiento del rostro, o el rostro o el p?rpado que se caen hacia un lado. ?A: Arms (brazos). Los signos son debilidad o adormecimiento en un brazo. Esto sucede de repente y generalmente en un lado del cuerpo. ?S: Speech (habla). Los signos son dificultad para hablar, hablar arrastrando las palabras o dificultad para comprender lo que las Forensic scientist. ?T: Time (tiempo). Es tiempo de llamar al servicio de Sports administrator. Anote la hora a la que [employer] s?ntomas. ?Presenta otros  signos de un accidente cerebrovascular, como los siguientes: ?Dolor de cabeza s?bito e intenso que no tiene causa aparente. ?N?useas o v?mitos. ?Convulsiones. ?Estos s?ntomas pueden Customer service manager. Solicite ayuda de inmediato. Llame al 911. ?No espere a ver si los s?ntomas desaparecen. ?No conduzca por sus propios medios [employer]. ?Resumen ?El s?ndrome de hipoventilaci?n y obesidad (SHO) causa una acumulaci?n de los niveles de di?xido de carbonoen la sangre y Neomia Dear disminuci?n de los niveles de ox?geno. ?El SHO puede aumentar el riesgo de insuficiencia card?aca, hipertensi?n pulmonar, discapacidad y [place]. ?Siga la dieta y el plan de actividad f?sica que le haya indicado el m?dico. ?Obtenga ayuda de inmediato si tiene cualquier s?ntoma de un accidente cerebrovascular. ?Esta informaci?n no tiene Theme park manager el consejo del m?dico. Aseg?rese de hacerle al m?dico cualquier pregunta que tenga. ?Document Revised: 03/26/2021 Document Reviewed: 03/26/2021 ?Elsevier Patient Education ? 2023 Elsevier Inc. ?Informaci?n sobre dormir bien, en adultos ?Quality Sleep Information, Adult ?Dormir bien es importante para la salud mental y f?sica. Tambi?n mejora la calidad de vida. Dormir bien significa que usted: ?Est? dormido durante la mayor parte del tiempo que est? en la cama. ?Se queda dormido en un plazo de 30 minutos. ?Se despierta no m?s de una vez por la noche.  ?Est? despierto durante no m?s de 20 minutos si se despierta durante la noche. ?La mayor?a de los adultos necesitan dormir bien entre 7 y 8 horas todas las noches. ??C?mo puede afectarme dormir mal? ?Si no duerme bien, puede presentar lo siguiente: ?Cambios en el estado de ?nimo. ?Somnolencia durante el d?a. ?Confusi?n. ?Disminuci?n del  tiempo de reacci?n. ?Trastornos del sue?o, como insomnio y apnea del sue?o. ?Dificultad para hacer lo siguiente: ?Solucionar problemas. ?Sobrellevar el estr?s. ?Prestar atenci?n. ?Estos problemas pueden afectar  su desempe?o y productividad en el trabajo, el estudio y Advice workerel hogar. La falta de sue?o tambi?n puede aumentar el riesgo de accidentes, suicidio y [address] Highwayconductas de [place] Hillsriesgo. ?Si no duerme bien, tambi?n puede co

## 2021-12-08 ENCOUNTER — Encounter: Payer: Self-pay | Admitting: Family Medicine

## 2021-12-09 MED ORDER — SEMAGLUTIDE (1 MG/DOSE) 4 MG/3ML ~~LOC~~ SOPN: 1.0000 mg | PEN_INJECTOR | SUBCUTANEOUS | 1 refills | Status: DC

## 2021-12-10 ENCOUNTER — Other Ambulatory Visit: Payer: Self-pay | Admitting: Family Medicine

## 2021-12-10 MED ORDER — TRAMADOL HCL 50 MG PO TABS: 50.0000 mg | ORAL_TABLET | Freq: Three times a day (TID) | ORAL | 0 refills | Status: AC | PRN

## 2021-12-10 NOTE — Telephone Encounter (Signed)
Tramadol renewed to help with pain.  I would have him see Dr. Benjamin Stain if symptoms persist.

## 2021-12-11 ENCOUNTER — Ambulatory Visit (INDEPENDENT_AMBULATORY_CARE_PROVIDER_SITE_OTHER): Payer: BC Managed Care – PPO | Admitting: Family Medicine

## 2021-12-11 VITALS — BP 140/88 | HR 79 | Temp 97.6°F | Ht 66.0 in | Wt 310.0 lb

## 2021-12-11 DIAGNOSIS — E291 Testicular hypofunction: Secondary | ICD-10-CM | POA: Diagnosis not present

## 2021-12-11 MED ORDER — TESTOSTERONE CYPIONATE 200 MG/ML IM SOLN
200.0000 mg | Freq: Once | INTRAMUSCULAR | Status: AC
  Administered 2021-12-11: 200 mg via INTRAMUSCULAR

## 2021-12-11 NOTE — Progress Notes (Signed)
Medical screening examination/treatment was performed by qualified clinical staff member and as supervising physician I was immediately available for consultation/collaboration. I have reviewed documentation and agree with assessment and plan.  Shoua Ressler, DO  

## 2021-12-11 NOTE — Progress Notes (Signed)
Patient is here for a testosterone injection. Denies chest pain, shortness of breath, headaches and problems with medication or mood changes.   Patient tolerated injection on RUOQ well without complications. Patient advised to schedule next injection in 14 days.   

## 2021-12-23 ENCOUNTER — Ambulatory Visit: Payer: BC Managed Care – PPO | Admitting: Family Medicine

## 2021-12-23 ENCOUNTER — Ambulatory Visit (INDEPENDENT_AMBULATORY_CARE_PROVIDER_SITE_OTHER): Payer: BC Managed Care – PPO

## 2021-12-23 ENCOUNTER — Ambulatory Visit: Payer: BC Managed Care – PPO

## 2021-12-23 ENCOUNTER — Ambulatory Visit: Payer: BC Managed Care – PPO | Admitting: Sports Medicine

## 2021-12-23 DIAGNOSIS — M7742 Metatarsalgia, left foot: Secondary | ICD-10-CM

## 2021-12-23 DIAGNOSIS — M79671 Pain in right foot: Secondary | ICD-10-CM | POA: Diagnosis not present

## 2021-12-23 DIAGNOSIS — M7751 Other enthesopathy of right foot: Secondary | ICD-10-CM | POA: Diagnosis not present

## 2021-12-23 MED ORDER — PREDNISONE 50 MG PO TABS: ORAL_TABLET | ORAL | 0 refills | Status: DC

## 2021-12-23 MED ORDER — MELOXICAM 15 MG PO TABS: ORAL_TABLET | ORAL | 3 refills | Status: DC

## 2021-12-23 NOTE — Progress Notes (Signed)
? ? ?  Procedures performed today:   ? ?None. ? ?Independent interpretation of notes and tests performed by another provider:  ? ?None. ? ?Brief History, Exam, Impression, and Recommendations:   ? 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 ?He has been on some topical nitroglycerin without significant improvement. ?On exam he has significant swelling just deep to the Achilles insertion. ?No obvious palpable Achilles nodules but he does have tenderness at the retrocalcaneal bursa. ?He does appear very inflamed so we will do steroids followed by meloxicam, bilateral heel lifts, x-rays. ?He will minimize his time on his feet for now. ?Return to see me in about 3 weeks and we will do a retrocalcaneal bursa injection followed by a week of boot immobilization if not better. ? ?Metatarsalgia of left foot ?Also having severe pain left second MTP plantar aspect consistent with metatarsalgia. ?I think the above steroids and NSAIDs will help, if insufficient improvement we will consider injection into the second MTP, and potentially custom orthotics with metatarsal pads. ? ?I spent 30 minutes of total time managing this patient today, this includes chart review, face to face, and non-face to face time. ? ?___________________________________________ ?Ihor Austin. Benjamin Stain, M.D., ABFM., CAQSM. ?Primary Care and Sports Medicine ?[facility] ? ?Adjunct Instructor of Family Medicine  ?[employer] ?

## 2021-12-23 NOTE — Assessment & Plan Note (Signed)
Also having severe pain left second MTP plantar aspect consistent with metatarsalgia. ?I think the above steroids and NSAIDs will help, if insufficient improvement we will consider injection into the second MTP, and potentially custom orthotics with metatarsal pads. ?

## 2021-12-23 NOTE — Assessment & Plan Note (Signed)
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 ?He has been on some topical nitroglycerin without significant improvement. ?On exam he has significant swelling just deep to the Achilles insertion. ?No obvious palpable Achilles nodules but he does have tenderness at the retrocalcaneal bursa. ?He does appear very inflamed so we will do steroids followed by meloxicam, bilateral heel lifts, x-rays. ?He will minimize his time on his feet for now. ?Return to see me in about 3 weeks and we will do a retrocalcaneal bursa injection followed by a week of boot immobilization if not better. ?

## 2021-12-24 ENCOUNTER — Telehealth: Payer: Self-pay | Admitting: Neurology

## 2021-12-24 NOTE — Telephone Encounter (Signed)
[employer] Berkley Harvey: [phone] (exp. 12/14/21 to 02/11/22) called interpreter- pt didnt pick up couldnt leave vmail the vmail not set up

## 2021-12-25 ENCOUNTER — Ambulatory Visit (INDEPENDENT_AMBULATORY_CARE_PROVIDER_SITE_OTHER): Payer: BC Managed Care – PPO | Admitting: Family Medicine

## 2021-12-25 VITALS — BP 137/89 | HR 78 | Ht 66.0 in | Wt 304.0 lb

## 2021-12-25 DIAGNOSIS — E291 Testicular hypofunction: Secondary | ICD-10-CM

## 2021-12-25 MED ORDER — TESTOSTERONE CYPIONATE 200 MG/ML IM SOLN
200.0000 mg | Freq: Once | INTRAMUSCULAR | Status: AC
  Administered 2021-12-25: 200 mg via INTRAMUSCULAR

## 2021-12-25 NOTE — Progress Notes (Signed)
Medical screening examination/treatment was performed by qualified clinical staff member and as supervising physician I was immediately available for consultation/collaboration. I have reviewed documentation and agree with assessment and plan.  Charisa Twitty, DO  

## 2021-12-25 NOTE — Progress Notes (Signed)
Pt here for testosterone injection no SOB,CP or mood swings. Injection tolerated well given in LUOQ. Pt to RTC in 2 weeks for next injection  

## 2021-12-30 ENCOUNTER — Other Ambulatory Visit: Payer: Self-pay | Admitting: Family Medicine

## 2022-01-05 ENCOUNTER — Encounter: Payer: Self-pay | Admitting: Neurology

## 2022-01-05 ENCOUNTER — Encounter: Payer: Self-pay | Admitting: Family Medicine

## 2022-01-06 MED ORDER — SEMAGLUTIDE (2 MG/DOSE) 8 MG/3ML ~~LOC~~ SOPN: 2.0000 mg | PEN_INJECTOR | SUBCUTANEOUS | 1 refills | Status: DC

## 2022-01-08 ENCOUNTER — Ambulatory Visit (INDEPENDENT_AMBULATORY_CARE_PROVIDER_SITE_OTHER): Payer: BC Managed Care – PPO | Admitting: Family Medicine

## 2022-01-08 VITALS — BP 138/81 | HR 103 | Resp 18 | Ht 66.0 in | Wt 305.0 lb

## 2022-01-08 DIAGNOSIS — E291 Testicular hypofunction: Secondary | ICD-10-CM

## 2022-01-08 MED ORDER — TESTOSTERONE CYPIONATE 200 MG/ML IM SOLN: 200.0000 mg | Freq: Once | INTRAMUSCULAR | Status: DC

## 2022-01-08 NOTE — Progress Notes (Signed)
Patient in office for Testosterone injection. Patient tolerated injection well with no side effects.

## 2022-01-10 NOTE — Progress Notes (Signed)
Medical screening examination/treatment was performed by qualified clinical staff member and as supervising physician I was immediately available for consultation/collaboration. I have reviewed documentation and agree with assessment and plan.  Ranferi Clingan, DO  

## 2022-01-13 ENCOUNTER — Ambulatory Visit: Payer: BC Managed Care – PPO | Admitting: Neurology

## 2022-01-13 DIAGNOSIS — K08109 Complete loss of teeth, unspecified cause, unspecified class: Secondary | ICD-10-CM

## 2022-01-13 DIAGNOSIS — R0683 Snoring: Secondary | ICD-10-CM

## 2022-01-13 DIAGNOSIS — G478 Other sleep disorders: Secondary | ICD-10-CM

## 2022-01-13 DIAGNOSIS — G4733 Obstructive sleep apnea (adult) (pediatric): Secondary | ICD-10-CM

## 2022-01-13 DIAGNOSIS — G4719 Other hypersomnia: Secondary | ICD-10-CM

## 2022-01-13 DIAGNOSIS — E662 Morbid (severe) obesity with alveolar hypoventilation: Secondary | ICD-10-CM

## 2022-01-13 DIAGNOSIS — R7303 Prediabetes: Secondary | ICD-10-CM

## 2022-01-20 NOTE — Progress Notes (Signed)
[facility]   HOME SLEEP TEST REPORT ( by Watch PAT)  William Rose   STUDY DATA:  01-20-2022   ORDERING CLINICIAN: Melvyn Novas, MD  REFERRING CLINICIAN:    CLINICAL INFORMATION/HISTORY: HISTORY OF PRESENT ILLNESS:  William Rose is a 56 y.o. year old Spaniard seen here as a referral on 12/07/2021 from PCP.  Chief concern according to patient :  encounter for Consult w interpreter. Pt referred by Everrett Coombe, DO. for loud snoring, and witnessed sleep apnea by spouse  Last SS over 5 years ago, normal workup-he was a smoker at that time. He had the study while in [place].  Pt noticed no change in symptoms since then, but gained weight since he quit smoking .  Wakes up with dry mouth some nights. Average 6 hrs of sleep.  Use to drink 16 small C of coffee now drinks about 8 C.   Epworth sleepiness score: 12/24.   BMI: 46 kg/m   Neck Circumference: 19"   FINDINGS:   Sleep Summary:   Total Recording Time (hours, min):   6 hours and 37 minutes of which 5 hours and 50 minutes was a calculated total sleep time.  Of the total sleep time, 31.8% were  REM sleep     Percent REM (%):  31.8%                                     Respiratory Indices:   Calculated pAHI (per hour):   24.8/h                          REM pAHI: 43/h                                               NREM pAHI:    16.4/h                          Positional AHI: The patient slept the majority of the night in prone position with an AHI of 21.5 followed by right right lateral sleep with an AHI of 34.8/h.  Snoring reached a mean volume of 52 dB and was extraordinarily loud and also present for 89% of the total sleep time.                                                  Oxygen Saturation Statistics:   O2 Saturation Range (%):  Between a nadir at 70% and a maximum of 100% with a mean saturation of 94%                                                      O2 Saturation (minutes) <89%:    27.5 minutes.  Moderate degree of hypoxia with a severe nadir at 70% saturation all clustered during REM sleep.        Pulse Rate Statistics:  Pulse Range: Between 63 and 113 bpm with a mean heart rate of 80 bpm.  Please note that this home sleep test can only give cardiac rate but not cardiac rhythm data.               IMPRESSION:  This HST confirms the presence of moderate severe sleep apnea which is strongly REM sleep dependent, associated with a moderate severity of hypoxia.  During REM sleep all hypoxic events seem to have clustered, down to a nadir of 70% saturation.  Based on this the patient is in need of positive airway pressure therapy as an inspire device or dental device cannot address hypoxia and REM dependent apnea.   RECOMMENDATION: Weight loss is very important to overcome REM dependent apnea, but in the meantime we should titrate the patient to positive airway pressure therapy in an attended in lab study.  This will allow us to switch modalities to BiPAP if needed and also to add oxygen if needed.  If the patient's insurance does not agree with this step I will ask for an autotitration CPAP between 6 and 20 cmH2O pressure 2 cm EPR, heated humidification and an interface of the patient's choice and preference.    INTERPRETING PHYSICIAN:   Carmen Dohmeier, MD   Medical Director of [facility].                           

## 2022-01-22 ENCOUNTER — Ambulatory Visit (INDEPENDENT_AMBULATORY_CARE_PROVIDER_SITE_OTHER): Payer: BC Managed Care – PPO | Admitting: Family Medicine

## 2022-01-22 VITALS — BP 140/88 | HR 85

## 2022-01-22 DIAGNOSIS — E291 Testicular hypofunction: Secondary | ICD-10-CM | POA: Diagnosis not present

## 2022-01-22 MED ORDER — TESTOSTERONE CYPIONATE 200 MG/ML IM SOLN
200.0000 mg | Freq: Once | INTRAMUSCULAR | Status: AC
  Administered 2022-01-22: 200 mg via INTRAMUSCULAR

## 2022-01-22 NOTE — Progress Notes (Unsigned)
Pt here for testosterone injection no SOB,CP or mood swings. Injection tolerated well given in LUOQ. Pt to RTC in 2 weeks for next injection  

## 2022-01-24 NOTE — Progress Notes (Signed)
Medical screening examination/treatment was performed by qualified clinical staff member and as supervising physician I was immediately available for consultation/collaboration. I have reviewed documentation and agree with assessment and plan.  Luria Rosario, DO  

## 2022-01-25 NOTE — Addendum Note (Signed)
Addended by: Melvyn Novas on: 01/25/2022 01:36 PM   Modules accepted: Orders

## 2022-01-25 NOTE — Procedures (Signed)
[facility]   HOME SLEEP TEST REPORT ( by Watch PAT)  William Rose   STUDY DATA:  01-20-2022   ORDERING CLINICIAN: Melvyn Novas, MD  REFERRING CLINICIAN:    CLINICAL INFORMATION/HISTORY: HISTORY OF PRESENT ILLNESS:  William Rose is a 56 y.o. year old Spaniard seen here as a referral on 12/07/2021 from PCP.  Chief concern according to patient :  encounter for Consult w interpreter. Pt referred by Everrett Coombe, DO. for loud snoring, and witnessed sleep apnea by spouse  Last SS over 5 years ago, normal workup-he was a smoker at that time. He had the study while in [place].  Pt noticed no change in symptoms since then, but gained weight since he quit smoking .  Wakes up with dry mouth some nights. Average 6 hrs of sleep.  Use to drink 16 small C of coffee now drinks about 8 C.   Epworth sleepiness score: 12/24.   BMI: 46 kg/m   Neck Circumference: 19"   FINDINGS:   Sleep Summary:   Total Recording Time (hours, min):   6 hours and 37 minutes of which 5 hours and 50 minutes was a calculated total sleep time.  Of the total sleep time, 31.8% were  REM sleep     Percent REM (%):  31.8%                                     Respiratory Indices:   Calculated pAHI (per hour):   24.8/h                          REM pAHI: 43/h                                               NREM pAHI:    16.4/h                          Positional AHI: The patient slept the majority of the night in prone position with an AHI of 21.5 followed by right right lateral sleep with an AHI of 34.8/h.  Snoring reached a mean volume of 52 dB and was extraordinarily loud and also present for 89% of the total sleep time.                                                  Oxygen Saturation Statistics:   O2 Saturation Range (%):  Between a nadir at 70% and a maximum of 100% with a mean saturation of 94%                                                      O2 Saturation (minutes) <89%:    27.5 minutes.  Moderate degree of hypoxia with a severe nadir at 70% saturation all clustered during REM sleep.        Pulse Rate Statistics:  Pulse Range: Between 63 and 113 bpm with a mean heart rate of 80 bpm.  Please note that this home sleep test can only give cardiac rate but not cardiac rhythm data.               IMPRESSION:  This HST confirms the presence of moderate severe sleep apnea which is strongly REM sleep dependent, associated with a moderate severity of hypoxia.  During REM sleep all hypoxic events seem to have clustered, down to a nadir of 70% saturation.  Based on this the patient is in need of positive airway pressure therapy as an inspire device or dental device cannot address hypoxia and REM dependent apnea.   RECOMMENDATION: Weight loss is very important to overcome REM dependent apnea, but in the meantime we should titrate the patient to positive airway pressure therapy in an attended in lab study.  This will allow [place] to switch modalities to BiPAP if needed and also to add oxygen if needed.  If the patient's insurance does not agree with this step I will ask for an autotitration CPAP between 6 and 20 cmH2O pressure 2 cm EPR, heated humidification and an interface of the patient's choice and preference.    INTERPRETING PHYSICIAN:   Melvyn Novas, MD   Medical Director of [facility].

## 2022-01-26 ENCOUNTER — Encounter: Payer: Self-pay | Admitting: Family Medicine

## 2022-01-26 ENCOUNTER — Encounter: Payer: Self-pay | Admitting: Neurology

## 2022-01-26 ENCOUNTER — Telehealth: Payer: Self-pay | Admitting: Neurology

## 2022-01-26 DIAGNOSIS — R7303 Prediabetes: Secondary | ICD-10-CM

## 2022-01-26 DIAGNOSIS — G4730 Sleep apnea, unspecified: Secondary | ICD-10-CM

## 2022-01-26 NOTE — Telephone Encounter (Signed)
Called the pt using spanish interpretor services to review SSR. There was no answer and no VM set up. Will send a mychart message as well.

## 2022-01-26 NOTE — Telephone Encounter (Signed)
-----   Message from Melvyn Novas, MD sent at 01/25/2022  1:35 PM EDT ----- O2 Saturation (minutes) <89%:27.5 minutes.  Moderate degree of hypoxia with a severe nadir at 70% saturation all clustered during REM sleep.  Pulse Rate Statistics:  Pulse Range: Between 63 and 113 bpm with a mean heart rate of 80 bpm.  Please note that this home sleep test can only give cardiac rate but not cardiac rhythm data.  IMPRESSION:  This HST confirms the presence of moderate severe sleep apnea which is strongly REM sleep dependent, associated with a moderate severity of hypoxia.  During REM sleep all hypoxic events seem to have clustered, down to a nadir of 70% saturation.  Based on this the patient is in need of positive airway pressure therapy as an inspire device or dental device cannot address hypoxia and REM dependent apnea.  RECOMMENDATION: Weight loss is very important to overcome REM dependent apnea, but in the meantime we should titrate the patient to positive airway pressure therapy in an attended in lab study.  This will allow [place] to switch modalities to BiPAP if needed and also to add oxygen if needed.  If the patient's insurance does not agree with this step I will ask for an autotitration CPAP between 6 and 20 cmH2O pressure 2 cm EPR, heated humidification and an interface of the patient's choice and preference.

## 2022-01-28 ENCOUNTER — Other Ambulatory Visit: Payer: Self-pay | Admitting: Family Medicine

## 2022-02-01 ENCOUNTER — Encounter: Payer: Self-pay | Admitting: Neurology

## 2022-02-01 ENCOUNTER — Ambulatory Visit: Payer: BC Managed Care – PPO | Admitting: Neurology

## 2022-02-01 VITALS — BP 135/98 | HR 93 | Ht 66.0 in | Wt 305.0 lb

## 2022-02-01 DIAGNOSIS — G4719 Other hypersomnia: Secondary | ICD-10-CM

## 2022-02-01 DIAGNOSIS — G4733 Obstructive sleep apnea (adult) (pediatric): Secondary | ICD-10-CM | POA: Diagnosis not present

## 2022-02-01 DIAGNOSIS — E66813 Obesity, class 3: Secondary | ICD-10-CM

## 2022-02-01 DIAGNOSIS — K08109 Complete loss of teeth, unspecified cause, unspecified class: Secondary | ICD-10-CM

## 2022-02-01 DIAGNOSIS — Z6841 Body Mass Index (BMI) 40.0 and over, adult: Secondary | ICD-10-CM

## 2022-02-01 DIAGNOSIS — E662 Morbid (severe) obesity with alveolar hypoventilation: Secondary | ICD-10-CM | POA: Diagnosis not present

## 2022-02-02 ENCOUNTER — Telehealth: Payer: Self-pay | Admitting: Neurology

## 2022-02-02 ENCOUNTER — Encounter: Payer: Self-pay | Admitting: Family Medicine

## 2022-02-03 MED ORDER — SEMAGLUTIDE (2 MG/DOSE) 8 MG/3ML ~~LOC~~ SOPN
2.0000 mg | PEN_INJECTOR | SUBCUTANEOUS | 1 refills | Status: DC
Start: 2022-02-03 — End: 2022-04-19

## 2022-02-05 ENCOUNTER — Ambulatory Visit (INDEPENDENT_AMBULATORY_CARE_PROVIDER_SITE_OTHER): Payer: BC Managed Care – PPO | Admitting: Sports Medicine

## 2022-02-05 VITALS — BP 135/88 | HR 82 | Ht 66.0 in | Wt 302.0 lb

## 2022-02-05 DIAGNOSIS — E291 Testicular hypofunction: Secondary | ICD-10-CM

## 2022-02-05 DIAGNOSIS — G4733 Obstructive sleep apnea (adult) (pediatric): Secondary | ICD-10-CM | POA: Diagnosis not present

## 2022-02-05 DIAGNOSIS — R7989 Other specified abnormal findings of blood chemistry: Secondary | ICD-10-CM

## 2022-02-05 NOTE — Progress Notes (Signed)
Patient is here for a testosterone injection of 200mg.   Given in RUOQ.  Denies chest pain, shortness of breath, headaches and problems with medication or mood changes.   Tolerated injection well without complications.   Patient advised to schedule next injection in 14 days.  

## 2022-02-11 ENCOUNTER — Encounter: Payer: Self-pay | Admitting: Neurology

## 2022-02-15 ENCOUNTER — Ambulatory Visit (INDEPENDENT_AMBULATORY_CARE_PROVIDER_SITE_OTHER): Payer: BC Managed Care – PPO | Admitting: Neurology

## 2022-02-15 DIAGNOSIS — G4733 Obstructive sleep apnea (adult) (pediatric): Secondary | ICD-10-CM

## 2022-02-15 DIAGNOSIS — G478 Other sleep disorders: Secondary | ICD-10-CM

## 2022-02-15 DIAGNOSIS — G4719 Other hypersomnia: Secondary | ICD-10-CM

## 2022-02-15 DIAGNOSIS — K08109 Complete loss of teeth, unspecified cause, unspecified class: Secondary | ICD-10-CM

## 2022-02-15 DIAGNOSIS — R0683 Snoring: Secondary | ICD-10-CM

## 2022-02-15 DIAGNOSIS — E662 Morbid (severe) obesity with alveolar hypoventilation: Secondary | ICD-10-CM

## 2022-02-15 DIAGNOSIS — G4734 Idiopathic sleep related nonobstructive alveolar hypoventilation: Secondary | ICD-10-CM | POA: Diagnosis not present

## 2022-02-19 NOTE — Addendum Note (Signed)
Addended by: Melvyn Novas on: 02/19/2022 03:18 PM   Modules accepted: Orders

## 2022-02-19 NOTE — Procedures (Signed)
PATIENT'S NAME:  William, Rose DOB:      [birth year]      MR#:    [id number]     DATE OF RECORDING: 02/15/2022 Charletta Cousin REFERRING M.D.:  Everrett Coombe, DO Study Performed:   CPAP  Titration HISTORY:  William Rose is 56 -year- old Spaniard and was referred by Everrett Coombe, DO. for loud snoring and witnessed sleep apnea ( by spouse). His HST was performed on 01-20-2022: Moderate severe OSA, worse on the right lateral side sleep position, and he loves to sleep prone. His snoring reached 52 dB. His BMI is still 49 on 3 months of Ozempic. He is curious about treatment and wanted to widen his understanding of apnea.  CPAP or BiPAP. HST on 6-14-with AHI of 24.8/h, REM AHI was 43/h. Hypoxia for 27 minutes with nadir at 70%.  Based on this the patient is in need of positive airway pressure therapy as an inspire device or dental device cannot address hypoxia and REM dependent apnea. The patient endorsed the Epworth Sleepiness Scale at 12 points and the Fatigue Score at 28 points.   The patient's weight 305 pounds with a height of 66 (inches), resulting in a BMI of 48.9 kg/m2. The patient's neck circumference measured 19 inches.  CURRENT MEDICATIONS: Arimidex, Nitrodur, Ozempic, Depostestosterone Cyprionate, Ultram    PROCEDURE:  This is a multichannel digital polysomnogram utilizing the SomnoStar 11.2 system.  Electrodes and sensors were applied and monitored per AASM Specifications.   EEG, EOG, Chin and Limb EMG, were sampled at 200 Hz.  ECG, Snore and Nasal Pressure, Thermal Airflow, Respiratory Effort, CPAP Flow and Pressure, Oximetry was sampled at 50 Hz. Digital video and audio were recorded.      CPAP was medium Simplus mask initiated at 5 cmH20 with heated humidity per AASM split night standards and pressure was advanced to 12 cmH20 because of hypopneas, apnea and desaturations.  At a PAP pressure of 12 cmH20, there was a reduction of the AHI to 0 over 13.5 minutes with improvement of sleep  apnea. Lights Out was at 20:35 and Lights On at 04:01. Total recording time (TRT) was 446.5 minutes, with a total sleep time (TST) of 308 minutes. The patient's sleep latency was 9 minutes.  REM latency was 108 minutes.  The sleep efficiency was 69.0 %.    SLEEP ARCHITECTURE: WASO (Wake after sleep onset) was 129.5 minutes.  There were 17 minutes in Stage N1, 190.5 minutes Stage N2, 70 minutes Stage N3 and 30.5 minutes in Stage REM.  The percentage of Stage N1 was 5.5%, Stage N2 was 61.9%, Stage N3 was 22.7% and Stage R (REM sleep) was 9.9%. In comparison to the patients baseline sleep, the sleep architecture on CPAP was notable for improved sleep duration, reduced fragmentation.   RESPIRATORY ANALYSIS:  There was a total of 70 respiratory events: 18 obstructive apneas, 4 central apneas and 0 mixed apneas with a total of 22 apneas and an apnea index (AI) of 4.3 /hour. There were 48 hypopneas with a hypopnea index of 9.4/hour.    The total APNEA/HYPOPNEA INDEX (AHI) was 13.6 /hour and the total RESPIRATORY DISTURBANCE INDEX was 13.6 /hour   7 events occurred in REM sleep and 63 events in NREM. The REM AHI was 13.8 /hour versus a non-REM AHI of 13.6 /hour.  The patient spent 187.5 minutes of total sleep time in the supine position and 121 minutes in non-supine. The supine AHI was 19.8/h, versus a non-supine AHI of  4.0.  OXYGEN SATURATION & C02:  The baseline 02 saturation was 94%, with the lowest being 71%. Time spent below 89% saturation equaled 6 minutes. The arousals were noted as: 21 were spontaneous, 0 were associated with PLMs, 7 were associated with respiratory events.  PERIODIC LIMB MOVEMENTS:  The patient had a total of 66 Periodic Limb Movements. The Periodic Limb Movement (PLM) Arousal index was 0 /hour. The patient took bathroom breaks. Snoring was noted. EKG was in keeping with normal sinus rhythm (NSR).   DIAGNOSIS Moderate Obstructive Sleep Apnea responded well to 12 cm water CPAP  while in non- supine sleep.,  Degree of Hypopnea /Apnea was very position dependent.  Sleep Related Hypoxemia improved under CPAP.  The diagnosis is related to obesity hypoventilation.    PLANS/RECOMMENDATIONS: The patient will receive a Simplus medium sized FFM or a mask/ interface of his choice.  Auto CPAP by ResMed will be set for 6 through 16 cm water pressure, 2 cm EPR . The goal is control of hypopneas and apneas in supine and non-supine sleep. The patient will benefit from weight loss.   CPAP therapy compliance is defined as 4 hours or more of nightly use.   DISCUSSION:  A follow up appointment will be scheduled in the Sleep Clinic at [facility].   Please call [phone] with any questions.      I certify that I have reviewed the entire raw data recording prior to the issuance of this report in accordance with the Standards of Accreditation of the American Academy of Sleep Medicine (AASM)      [] , M.D. Diplomat, [facility] of Psychiatry and Neurology  Diplomat, Biomedical engineer of Sleep Medicine Biomedical engineer, Wellsite geologist Sleep at [employer]

## 2022-02-22 ENCOUNTER — Encounter: Payer: Self-pay | Admitting: Neurology

## 2022-03-07 DIAGNOSIS — G4733 Obstructive sleep apnea (adult) (pediatric): Secondary | ICD-10-CM | POA: Diagnosis not present

## 2022-03-19 ENCOUNTER — Ambulatory Visit (INDEPENDENT_AMBULATORY_CARE_PROVIDER_SITE_OTHER): Payer: BC Managed Care – PPO | Admitting: Family Medicine

## 2022-03-19 VITALS — BP 130/87 | HR 89

## 2022-03-19 DIAGNOSIS — E291 Testicular hypofunction: Secondary | ICD-10-CM | POA: Diagnosis not present

## 2022-03-19 NOTE — Progress Notes (Unsigned)
   Established Patient Office Visit  Subjective   Patient ID: Vernis Eid, male    DOB: 1966-02-17  Age: 56 y.o. MRN: [id number]  Chief Complaint  Patient presents with   Hypogonadism    HPI  Kalief Kattner Gebbia is here for a testosterone injection.    ROS    Objective:     BP 130/87   Pulse 89   SpO2 97%  {Vitals History (Optional):23777}  Physical Exam   No results found for any visits on 03/19/22.  {Labs (Optional):23779}  The 10-year ASCVD risk score (Arnett DK, et al., 2019) is: 5.4%* (Cholesterol units were assumed)    Assessment & Plan:  Hypogonadism - Patient tolerated injection well without complications. Patient advised to schedule next injection 14 days from today.    Problem List Items Addressed This Visit   None Visit Diagnoses     Male hypogonadism    -  Primary       Return in about 2 weeks (around 04/02/2022) for testosterone injection. Earna Coder, Janalyn Harder, CMA

## 2022-03-21 NOTE — Progress Notes (Signed)
Medical screening examination/treatment was performed by qualified clinical staff member and as supervising physician I was immediately available for consultation/collaboration. I have reviewed documentation and agree with assessment and plan.  Becki Mccaskill, DO  

## 2022-03-24 ENCOUNTER — Encounter: Payer: Self-pay | Admitting: Sports Medicine

## 2022-03-27 DIAGNOSIS — H5203 Hypermetropia, bilateral: Secondary | ICD-10-CM | POA: Diagnosis not present

## 2022-03-27 DIAGNOSIS — H524 Presbyopia: Secondary | ICD-10-CM | POA: Diagnosis not present

## 2022-04-02 ENCOUNTER — Ambulatory Visit (INDEPENDENT_AMBULATORY_CARE_PROVIDER_SITE_OTHER): Payer: BC Managed Care – PPO | Admitting: Family Medicine

## 2022-04-02 VITALS — BP 146/96 | HR 98 | Resp 20

## 2022-04-02 DIAGNOSIS — E291 Testicular hypofunction: Secondary | ICD-10-CM

## 2022-04-02 NOTE — Progress Notes (Unsigned)
   Subjective:    Patient ID: William Rose, male    DOB: 10/29/1965, 56 y.o.   MRN: [id number]  HPI Denilson Salminen Villagran is here for a testosterone injection. Denies chest pain, SOB, headaches and problems with medication or mood changes.     Location: Left Upper Outer Quadrant   Review of Systems     Objective:   Physical Exam  BP 146/96   Pulse 98  SpO2 96%         Assessment & Plan:   Patient tolerated injection well without complication. Patient advised to schedule next injection in 30 days. Patient also advised to schedule a nurse visit in 2 weeks for BP check.

## 2022-04-04 NOTE — Progress Notes (Signed)
Medical screening examination/treatment was performed by qualified clinical staff member and as supervising physician I was immediately available for consultation/collaboration. I have reviewed documentation and agree with assessment and plan.  Chloe Baig, DO  

## 2022-04-05 ENCOUNTER — Ambulatory Visit: Payer: BC Managed Care – PPO | Admitting: Neurology

## 2022-04-07 DIAGNOSIS — G4733 Obstructive sleep apnea (adult) (pediatric): Secondary | ICD-10-CM | POA: Diagnosis not present

## 2022-04-14 NOTE — Progress Notes (Signed)
Patient: William Rose Date of Birth: Jan 16, 1966  Reason for Visit: Follow up initial CPAP History from: Patient, interpreter Primary Neurologist: Dr. Vickey Huger  ASSESSMENT AND PLAN 56 y.o. year old male   1.  Moderate severe sleep apnea on CPAP -Download shows overall excellent he is encouraged to continue nightly use greater than 4 hours -May try increasing the humidity to create a tighter seal on the mask, if still feels leaking may need mask refit, ordered continued supplies -Is considering gastric bypass in the future  -Return here in 1 year or sooner if needed  HISTORY OF PRESENT ILLNESS: Today 04/15/22 William Rose is here today for initial CPAP visit.  Download below looks excellent. There is some leak with the mask at 23.1. doing well with CPAP. Using full face mask. Sometimes feels leak, it will wake him up, will push it in, it's ok. No more snoring. Feels more rested. Mouth isn't dry anymore. Is still on Ozempic, waiting for gastric bypass. Feels enough pressure from CPAP. Feels the silicone on the mask isn't as sticky. ESS 13  IMPRESSION:  This HST confirms the presence of moderate severe sleep apnea which is strongly REM sleep dependent, associated with a moderate severity of hypoxia.  During REM sleep all hypoxic events seem to have clustered, down to a nadir of 70% saturation.  Based on this the patient is in need of positive airway pressure therapy as an inspire device or dental device cannot address hypoxia and REM dependent apnea.     HISTORY  Copied Dr. Vickey Huger 02/01/22: William Rose is a 56 y.o. year old Spaniard seen here as a referral on 02/01/2022 from PCP.  Chief concern according to patient :  Rm 1, w interpreter. Pt was  referred by Everrett Coombe, DO. for loud snoring, and witnessed sleep apnea by spouse. His HST was performed on 01-20-2022: Moderate severe OSA, worse on the right lateral side sleep position, and he loves to sleep prone. His  snoring reached 52 dB. His BMI is still 49 on 3 months of Ozempic. He is curious about treatment and wanted to widen his understanding of apnea.  CPAP or BiPAP. He is a little scared, he stated through his interpreter. He was advised of compliance need for insurance to cover the costs. He needs an interface to allow prone sleep.   Last SS over 5 years ago, normal workup-he was a smoker at that time. He had the study while in [place].   Pt noticed no change in symptoms since then, but gained weight since he quit smoking .  Wakes up with dry mouth some nights. Average 6 hrs of sleep.  Use to drink 16 small C of coffee now drinks about 8 C.    William Rose  has no past medical history on file.     Sleep relevant medical history: Nocturia once, low spine surgery.   Family medical /sleep history: No other family member on CPAP with OSA.    Social history:  Patient is working in [employer],  Special educational needs teacher, and and lives in a household with spouse,  4 children are grown.  The patient currently works/ used to work in shifts( rotating,) 4 early and 4 late.  Tobacco use- quit 4 years ago.  ETOH use : quit ,  Caffeine intake in form of Coffee( 8 cups a day). Regular exercise: none .     Sleep habits are as follows: The patient's dinner time is between  8 PM. The patient goes to bed at 9 PM and continues to sleep for 5-6 hours, wakes for one bathroom breaks. The preferred sleep position is prone, with the support of 2 pillows.  Dreams are reportedly frequent/vivid.  3.30 for early shift 3.30  AM is the usual rise time.  The patient wakes up spontaneously before an alarm.  He reports not feeling refreshed or restored in AM, with symptoms such as dry mouth, and residual fatigue. Naps are taken infrequently.   REVIEW OF SYSTEMS: Out of a complete 14 system review of symptoms, the patient complains only of the following symptoms, and all other reviewed systems are negative.  See  HPI  ALLERGIES: No Known Allergies  HOME MEDICATIONS: Outpatient Medications Prior to Visit  Medication Sig Dispense Refill   anastrozole (ARIMIDEX) 1 MG tablet      Semaglutide, 2 MG/DOSE, 8 MG/3ML SOPN Inject 2 mg as directed once a week. 9 mL 1   meloxicam (MOBIC) 15 MG tablet One tab PO every 24 hours with a meal for 2 weeks, then once every 24 hours prn pain. 30 tablet 3   Facility-Administered Medications Prior to Visit  Medication Dose Route Frequency Provider Last Rate Last Admin   testosterone cypionate (DEPOTESTOSTERONE CYPIONATE) injection 200 mg  200 mg Intramuscular Q14 Days Everrett Coombe, DO   200 mg at 04/02/22 1056    PAST MEDICAL HISTORY: No past medical history on file.  PAST SURGICAL HISTORY: Past Surgical History:  Procedure Laterality Date   CARPAL TUNNEL RELEASE      FAMILY HISTORY: Family History  Problem Relation Age of Onset   Epilepsy Mother     SOCIAL HISTORY: Social History   Socioeconomic History   Marital status: Married    Spouse name: Debby Bud   Number of children: 4   Years of education: Not on file   Highest education level: Some college, no degree  Occupational History    Employer: [employer]  Tobacco Use   Smoking status: Former    Packs/day: 1.00    Years: 30.00    Total pack years: 30.00    Types: Cigarettes    Quit date: 08/09/2017    Years since quitting: 4.6   Smokeless tobacco: Never  Vaping Use   Vaping Use: Never used  Substance and Sexual Activity   Alcohol use: Never   Drug use: Never   Sexual activity: Yes    Partners: Female  Other Topics Concern   Not on file  Social History Narrative   Lives with wife   R handed   Caffeine: use to drink 16 C of coffee a day and now 8 coffee a day   Social Determinants of Corporate investment banker Strain: Not on file  Food Insecurity: Not on file  Transportation Needs: Not on file  Physical Activity: Not on file  Stress: Not on file  Social Connections: Not on file   Intimate Partner Violence: Not on file    PHYSICAL EXAM  Vitals:   04/15/22 1250  BP: 130/85  Pulse: 94  Weight: (!) 303 lb (137.4 kg)  Height: 5\' 6"  (1.676 m)   Body mass index is 48.91 kg/m.  Generalized: Well developed, in no acute distress  Neurological examination  Mentation: Alert oriented to time, place, history taking. Follows all commands speech and language fluent Cranial nerve II-XII: Pupils were equal round reactive to light. Extraocular movements were full, visual field were full on confrontational test. Facial sensation and strength  were normal.  Head turning and shoulder shrug  were normal and symmetric. Motor: The motor testing reveals 5 over 5 strength of all 4 extremities. Good symmetric motor tone is noted throughout.  Sensory: Sensory testing is intact to soft touch on all 4 extremities. No evidence of extinction is noted.  Coordination: Cerebellar testing reveals good finger-nose-finger and heel-to-shin bilaterally.  Gait and station: Gait is normal, wide with large body habitus  DIAGNOSTIC DATA (LABS, IMAGING, TESTING) - I reviewed patient records, labs, notes, testing and imaging myself where available.  Lab Results  Component Value Date   WBC 10.4 03/27/2021   HGB 15.8 03/27/2021   HCT 49 03/27/2021   MCV 87.5 09/26/2020   PLT 338 03/27/2021      Component Value Date/Time   NA 139 09/16/2021 0000   NA 138 02/12/2021 0000   K 4.5 09/16/2021 0000   CL 104 09/16/2021 0000   CO2 28 09/16/2021 0000   GLUCOSE 102 (H) 09/16/2021 0000   BUN 20 09/16/2021 0000   BUN 16 02/12/2021 0000   CREATININE 0.81 09/16/2021 0000   CALCIUM 9.2 09/16/2021 0000   PROT 6.8 09/16/2021 0000   ALBUMIN 4.6 02/12/2021 0000   AST 18 09/16/2021 0000   ALT 20 09/16/2021 0000   ALKPHOS 64 02/12/2021 0000   BILITOT 0.8 09/16/2021 0000   GFRNONAA 98 02/12/2021 0000   GFRNONAA 105 09/26/2020 0927   GFRAA 119 02/12/2021 0000   GFRAA 121 09/26/2020 0927   Lab Results   Component Value Date   CHOL 156 02/12/2021   HDL 39 02/12/2021   LDLCALC 91 02/12/2021   TRIG 130 02/12/2021   CHOLHDL 4.3 09/26/2020   Lab Results  Component Value Date   HGBA1C 5.7 (H) 10/23/2021   No results found for: "VITAMINB12" Lab Results  Component Value Date   TSH 1.19 03/27/2021    Margie Ege, AGNP-C, DNP 04/15/2022, 1:34 PM [facility] [address] [place], [place] [address] [phone]

## 2022-04-15 ENCOUNTER — Encounter: Payer: Self-pay | Admitting: Neurology

## 2022-04-15 ENCOUNTER — Ambulatory Visit: Payer: BC Managed Care – PPO | Admitting: Neurology

## 2022-04-15 VITALS — BP 130/85 | HR 94 | Ht 66.0 in | Wt 303.0 lb

## 2022-04-15 DIAGNOSIS — Z6841 Body Mass Index (BMI) 40.0 and over, adult: Secondary | ICD-10-CM | POA: Diagnosis not present

## 2022-04-15 DIAGNOSIS — G4733 Obstructive sleep apnea (adult) (pediatric): Secondary | ICD-10-CM

## 2022-04-15 DIAGNOSIS — E662 Morbid (severe) obesity with alveolar hypoventilation: Secondary | ICD-10-CM

## 2022-04-15 NOTE — Patient Instructions (Signed)
Continue CPAP use If you feel like the mask continues to leak we can order a mask refit Use CPAP nightly greater than 4 hours See you back in 1 year

## 2022-04-16 ENCOUNTER — Encounter: Payer: Self-pay | Admitting: Family Medicine

## 2022-04-19 MED ORDER — SEMAGLUTIDE (2 MG/DOSE) 8 MG/3ML ~~LOC~~ SOPN: 2.0000 mg | PEN_INJECTOR | SUBCUTANEOUS | 1 refills | Status: DC

## 2022-04-22 ENCOUNTER — Telehealth: Payer: Self-pay

## 2022-04-22 NOTE — Telephone Encounter (Signed)
Error

## 2022-04-22 NOTE — Telephone Encounter (Signed)
CPAP order for supply renewal sent via fax to [facility].

## 2022-04-23 ENCOUNTER — Ambulatory Visit (INDEPENDENT_AMBULATORY_CARE_PROVIDER_SITE_OTHER): Payer: BC Managed Care – PPO | Admitting: Family Medicine

## 2022-04-23 VITALS — BP 128/86 | HR 80 | Temp 97.6°F | Ht 68.11 in | Wt 304.0 lb

## 2022-04-23 DIAGNOSIS — E291 Testicular hypofunction: Secondary | ICD-10-CM | POA: Diagnosis not present

## 2022-04-23 MED ORDER — TESTOSTERONE CYPIONATE 200 MG/ML IM SOLN
200.0000 mg | Freq: Once | INTRAMUSCULAR | Status: AC
  Administered 2022-04-23: 200 mg via INTRAMUSCULAR

## 2022-04-23 NOTE — Progress Notes (Unsigned)
Patient is here for a testosterone injection and blood pressure check. Denies trouble sleeping, palpitations, dizziness, lightheadedness, blurry vision, chest pain, shortness of breath, headaches, mood changes and/or medication problems.   Patient tolerated injection on RUOQ well without complications. Patient's blood pressure was within goal range. Patient advised to schedule next injection in 14 days.

## 2022-04-25 NOTE — Progress Notes (Signed)
Medical screening examination/treatment was performed by qualified clinical staff member and as supervising physician I was immediately available for consultation/collaboration. I have reviewed documentation and agree with assessment and plan.  Clatie Kessen, DO  

## 2022-05-07 ENCOUNTER — Ambulatory Visit (INDEPENDENT_AMBULATORY_CARE_PROVIDER_SITE_OTHER): Payer: BC Managed Care – PPO | Admitting: Family Medicine

## 2022-05-07 VITALS — BP 143/88 | HR 93

## 2022-05-07 DIAGNOSIS — E291 Testicular hypofunction: Secondary | ICD-10-CM | POA: Diagnosis not present

## 2022-05-07 MED ORDER — TESTOSTERONE CYPIONATE 200 MG/ML IM SOLN
200.0000 mg | Freq: Once | INTRAMUSCULAR | Status: AC
  Administered 2022-05-07: 200 mg via INTRAMUSCULAR

## 2022-05-07 NOTE — Progress Notes (Signed)
Medical screening examination/treatment was performed by qualified clinical staff member and as supervising physician I was immediately available for consultation/collaboration. I have reviewed documentation and agree with assessment and plan.  Hasini Peachey, DO  

## 2022-05-07 NOTE — Progress Notes (Signed)
   Established Patient Office Visit  Subjective   Patient ID: William Rose, male    DOB: 25-Apr-1966  Age: 56 y.o. MRN: [id number]  No chief complaint on file.   HPI    ROS    Objective:     There were no vitals taken for this visit.   Physical Exam   No results found for any visits on 05/07/22.    The 10-year ASCVD risk score (Arnett DK, et al., 2019) is: 5.2%* (Cholesterol units were assumed)    Assessment & Plan:  Patient presents today for Testerone injection and he denied chest pain and and dizziness only symptoms he was having was rib pain and headaches and spoke to him about setting up an appointment and administered injection and patient tolerated well advised to follow up in 14 days for his next injection. Problem List Items Addressed This Visit   None Visit Diagnoses     Male hypogonadism    -  Primary   Relevant Medications   testosterone cypionate (DEPOTESTOSTERONE CYPIONATE) injection 200 mg (Start on 05/07/2022 12:00 PM)       No follow-ups on file.    Tarri Glenn, CMA

## 2022-05-08 DIAGNOSIS — G4733 Obstructive sleep apnea (adult) (pediatric): Secondary | ICD-10-CM | POA: Diagnosis not present

## 2022-05-12 DIAGNOSIS — R7989 Other specified abnormal findings of blood chemistry: Secondary | ICD-10-CM | POA: Diagnosis not present

## 2022-05-12 DIAGNOSIS — E785 Hyperlipidemia, unspecified: Secondary | ICD-10-CM | POA: Diagnosis not present

## 2022-05-12 DIAGNOSIS — R7303 Prediabetes: Secondary | ICD-10-CM | POA: Diagnosis not present

## 2022-05-14 DIAGNOSIS — G4733 Obstructive sleep apnea (adult) (pediatric): Secondary | ICD-10-CM | POA: Diagnosis not present

## 2022-05-14 DIAGNOSIS — R0683 Snoring: Secondary | ICD-10-CM | POA: Diagnosis not present

## 2022-05-18 ENCOUNTER — Other Ambulatory Visit (HOSPITAL_COMMUNITY): Payer: Self-pay | Admitting: General Surgery

## 2022-05-18 DIAGNOSIS — R7989 Other specified abnormal findings of blood chemistry: Secondary | ICD-10-CM

## 2022-05-18 DIAGNOSIS — R7303 Prediabetes: Secondary | ICD-10-CM

## 2022-05-18 DIAGNOSIS — E785 Hyperlipidemia, unspecified: Secondary | ICD-10-CM

## 2022-05-21 ENCOUNTER — Other Ambulatory Visit (HOSPITAL_COMMUNITY): Payer: Self-pay | Admitting: General Surgery

## 2022-05-21 ENCOUNTER — Ambulatory Visit (HOSPITAL_COMMUNITY)
Admission: RE | Admit: 2022-05-21 | Discharge: 2022-05-21 | Disposition: A | Discharge status: Home / Self Care | Payer: BC Managed Care – PPO | Source: Ambulatory Visit | Attending: General Surgery | Admitting: General Surgery

## 2022-05-21 DIAGNOSIS — R7989 Other specified abnormal findings of blood chemistry: Secondary | ICD-10-CM | POA: Insufficient documentation

## 2022-05-21 DIAGNOSIS — E785 Hyperlipidemia, unspecified: Secondary | ICD-10-CM

## 2022-05-21 DIAGNOSIS — R7303 Prediabetes: Secondary | ICD-10-CM | POA: Insufficient documentation

## 2022-05-21 DIAGNOSIS — Z01818 Encounter for other preprocedural examination: Secondary | ICD-10-CM | POA: Diagnosis not present

## 2022-05-21 DIAGNOSIS — Z6841 Body Mass Index (BMI) 40.0 and over, adult: Secondary | ICD-10-CM | POA: Diagnosis not present

## 2022-05-26 ENCOUNTER — Encounter (INDEPENDENT_AMBULATORY_CARE_PROVIDER_SITE_OTHER): Payer: BC Managed Care – PPO | Admitting: Sports Medicine

## 2022-05-26 DIAGNOSIS — M79672 Pain in left foot: Secondary | ICD-10-CM

## 2022-05-26 DIAGNOSIS — M79671 Pain in right foot: Secondary | ICD-10-CM

## 2022-05-28 ENCOUNTER — Ambulatory Visit (INDEPENDENT_AMBULATORY_CARE_PROVIDER_SITE_OTHER): Payer: BC Managed Care – PPO | Admitting: Sports Medicine

## 2022-05-28 VITALS — BP 144/87 | HR 81

## 2022-05-28 DIAGNOSIS — R7989 Other specified abnormal findings of blood chemistry: Secondary | ICD-10-CM | POA: Diagnosis not present

## 2022-05-28 DIAGNOSIS — E291 Testicular hypofunction: Secondary | ICD-10-CM

## 2022-05-28 MED ORDER — TESTOSTERONE CYPIONATE 200 MG/ML IM SOLN
200.0000 mg | INTRAMUSCULAR | Status: DC
  Administered 2022-05-28 – 2022-06-25 (×2): 200 mg via INTRAMUSCULAR

## 2022-05-28 NOTE — Telephone Encounter (Signed)
Please see the MyChart message reply(ies) for my assessment and plan.    This patient gave consent for this Medical Advice Message and is aware that it may result in a bill to their insurance company, as well as the possibility of receiving a bill for a co-payment or deductible. They are an established patient, but are not seeking medical advice exclusively about a problem treated during an in person or video visit in the last seven days. I did not recommend an in person or video visit within seven days of my reply.    I spent a total of 6 minutes cumulative time within 7 days through MyChart messaging.  Ugonna Keirsey, DO   

## 2022-05-28 NOTE — Progress Notes (Signed)
Pt here for testosterone injection no SOB,CP or mood swings. Injection tolerated well given in RUOQ. Pt will RTC in 2 weeks for next injection.

## 2022-06-01 ENCOUNTER — Encounter: Payer: BC Managed Care – PPO | Attending: General Surgery | Admitting: Dietician

## 2022-06-01 ENCOUNTER — Encounter: Payer: Self-pay | Admitting: Dietician

## 2022-06-01 DIAGNOSIS — E669 Obesity, unspecified: Secondary | ICD-10-CM

## 2022-06-01 DIAGNOSIS — Z713 Dietary counseling and surveillance: Secondary | ICD-10-CM | POA: Insufficient documentation

## 2022-06-01 NOTE — Progress Notes (Signed)
Nutrition Assessment for Bariatric Surgery Medical Nutrition Therapy Appt Start Time: 10:05    End Time: 11:35  Patient was seen on 06/01/2022 for Pre-Operative Nutrition Assessment. Letter of approval faxed to [facility] bariatric surgery program coordinator on 06/01/2022.   Referral stated Supervised Weight Loss (SWL) visits needed: 0  Pt completed visits.   Pt has cleared nutrition requirements.  Planned surgery: RYGB   NUTRITION ASSESSMENT   Anthropometrics  Start weight at [facility]: 300.0 lbs (date: 06/01/2022) or 136.3 kg Height: 67 in BMI: 46.99 kg/m2     Clinical  Medical hx: Obesity, sleep apnea Medications: Vit D, semaglutide   Labs: A1C 5.7; HDL 36; LDL 106 Notable signs/symptoms: nothing noted Any previous deficiencies? No  Micronutrient Nutrition Focused Physical Exam: Hair: No issues observed Eyes: No issues observed Mouth: No issues observed Neck: No issues observed Nails: No issues observed Skin: No issues observed  Lifestyle & Dietary Hx  Pt arrived with spanish interpretor.  Pt states he is originally from [place], stating he speaks [place] first, but also knows [place]. Pt measures his weight in kilograms. Patient lives with his wife. Mr. Patient performs the food shopping and prepares the meals. He reports that he typically skips or misses 0 out of 21 possible meals per week. He may have 0-1 meals per week that are take-out or at a restaurant.  Patient works as a Games developer. He denies binge eating though has felt shame and/or guilt after eating too much food.  He denies having used laxatives or vomiting to facilitate weight loss. He denies emotional eating during times of stress. He states that he knows the difference between hunger and thirst and can tell when he is full. Pt states he has to sample the chips/snacks at work for [employer]. Pt came up with the idea to spit out the sample after he tastes the  food.  Physical Activity: Walking at work 12,000 to 15,000 steps, rare walk in the woods  Sleep Hygiene: duration and quality: pt states he uses a CPAP machine, 5-6 hours a night  Current Patient Perceived Stress Level as stated by pt on a scale of 1-10:  10      Stress Management Techniques: fishing  Fruit servings per week: 21-28 Non starchy vegetable servings per week: 7-14 Whole Grains per week: 0   24-Hr Dietary Recall First Meal: coffee, watermelon,  Snack:  Second Meal: sandwich with fruit Snack:  Third Meal: fruit or small sandwich or pasta Snack:  Beverages: coffee, green tea no sugar, sparkling water, rarely a pepsi  Alcoholic beverages per week: 0-1   Estimated Energy Needs Calories: 1800   NUTRITION DIAGNOSIS  Overweight/obesity ([place]-3.3) related to past poor dietary habits and physical inactivity as evidenced by patient w/ planned RYGB surgery following dietary guidelines for continued weight loss.    NUTRITION INTERVENTION  Nutrition counseling (C-1) and education (E-2) to facilitate bariatric surgery goals.  Educated pt on micronutrient deficiencies post surgery and strategies to mitigate that risk   Pre-Op Goals Reviewed with the Patient Track food and beverage intake (pen and paper, MyFitness Pal, Baritastic app, etc.) Make healthy food choices while monitoring portion sizes Consume 3 meals per day or try to eat every 3-5 hours Avoid concentrated sugars and fried foods Keep sugar & fat in the single digits per serving on food labels Practice CHEWING your food (aim for applesauce consistency) Practice not drinking 15 minutes before, during, and 30 minutes after each meal and  snack Avoid all carbonated beverages (ex: soda, sparkling beverages)  Limit caffeinated beverages (ex: coffee, tea, energy drinks) Avoid all sugar-sweetened beverages (ex: regular soda, sports drinks)  Avoid alcohol  Aim for 64-100 ounces of FLUID daily (with at least half of fluid  intake being plain water)  Aim for at least 60-80 grams of PROTEIN daily Look for a liquid protein source that contains ?15 g protein and ?5 g carbohydrate (ex: shakes, drinks, shots) Make a list of non-food related activities Physical activity is an important part of a healthy lifestyle so keep it moving! The goal is to reach 150 minutes of exercise per week, including cardiovascular and weight baring activity.  *Goals that are bolded indicate the pt would like to start working towards these  Handouts Provided Include  Bariatric Surgery handouts (Nutrition Visits, Pre-Op Goals, Protein Shakes, Vitamins & Minerals) Pre-Op Goals (in Spanish)  Learning Style & Readiness for Change Teaching method utilized: Visual & Auditory  Demonstrated degree of understanding via: Teach Back  Readiness Level: contemplative Barriers to learning/adherence to lifestyle change: cultural differences  RD's Notes for Next Visit     MONITORING & EVALUATION Dietary intake, weekly physical activity, body weight, and pre-op goals reached at next nutrition visit.    Next Steps  Pt has completed visits. No further supervised visits required/recomended  Patient is to follow up at [facility] for Pre-Op Class >2 weeks before surgery for further nutrition education.

## 2022-06-02 DIAGNOSIS — Z01818 Encounter for other preprocedural examination: Secondary | ICD-10-CM | POA: Diagnosis not present

## 2022-06-02 DIAGNOSIS — F4323 Adjustment disorder with mixed anxiety and depressed mood: Secondary | ICD-10-CM | POA: Diagnosis not present

## 2022-06-07 ENCOUNTER — Ambulatory Visit: Payer: BC Managed Care – PPO | Admitting: Family Medicine

## 2022-06-07 ENCOUNTER — Encounter: Payer: Self-pay | Admitting: Family Medicine

## 2022-06-07 DIAGNOSIS — Z6841 Body Mass Index (BMI) 40.0 and over, adult: Secondary | ICD-10-CM | POA: Diagnosis not present

## 2022-06-07 DIAGNOSIS — G4734 Idiopathic sleep related nonobstructive alveolar hypoventilation: Secondary | ICD-10-CM | POA: Diagnosis not present

## 2022-06-07 DIAGNOSIS — R7989 Other specified abnormal findings of blood chemistry: Secondary | ICD-10-CM | POA: Diagnosis not present

## 2022-06-07 DIAGNOSIS — G4733 Obstructive sleep apnea (adult) (pediatric): Secondary | ICD-10-CM

## 2022-06-07 DIAGNOSIS — R7303 Prediabetes: Secondary | ICD-10-CM

## 2022-06-07 MED ORDER — SEMAGLUTIDE (2 MG/DOSE) 8 MG/3ML ~~LOC~~ SOPN: 2.0000 mg | PEN_INJECTOR | SUBCUTANEOUS | 1 refills | Status: DC

## 2022-06-07 NOTE — Progress Notes (Signed)
William Rose - 56 y.o. male MRN [id number]  Date of birth: 09-16-1965  Subjective No chief complaint on file.  Due to language barrier, an interpreter was present during the history-taking and subsequent discussion (and for part of the physical exam) with this patient.  HPI William Rose is a 56 year old male here today for follow-up visit.  He reports he is doing pretty well.  He is continued on Ozempic to maximally titrated dose.  Tolerating well.  He has noted some appetite suppression with this.  Weight is down about 10 pounds over the past few months.  He has met with bariatric surgeon and is considering having gastric bypass.  He would like to have this after the first the year.  He will be traveling back to [place] for the holidays.  Feels pretty good with current dose of testosterone.  He is not sure if he notices any significant improvement with this.  He is doing quite well with CPAP.  He reports that this has significantly improved his energy levels and sleep quality.  ROS:  A comprehensive ROS was completed and negative except as noted per HPI  No Known Allergies  History reviewed. No pertinent past medical history.  Past Surgical History:  Procedure Laterality Date   CARPAL TUNNEL RELEASE      Social History   Socioeconomic History   Marital status: Married    Spouse name: Venora Maples   Number of children: 4   Years of education: Not on file   Highest education level: Some college, no degree  Occupational History    Employer: [employer]  Tobacco Use   Smoking status: Former    Packs/day: 1.00    Years: 30.00    Total pack years: 30.00    Types: Cigarettes    Quit date: 08/09/2017    Years since quitting: 4.8   Smokeless tobacco: Never  Vaping Use   Vaping Use: Never used  Substance and Sexual Activity   Alcohol use: Never   Drug use: Never   Sexual activity: Yes    Partners: Female  Other Topics Concern   Not on file  Social History Narrative   Lives with wife    R handed   Caffeine: use to drink 16 C of coffee a day and now 8 coffee a day   Social Determinants of Health   Financial Resource Strain: Not on file  Food Insecurity: Not on file  Transportation Needs: Not on file  Physical Activity: Not on file  Stress: Not on file  Social Connections: Not on file    Family History  Problem Relation Age of Onset   Epilepsy Mother     Health Maintenance  Topic Date Due   Lung Cancer Screening  09/09/2022 (Originally 01/04/2017)   Hepatitis C Screening  10/20/2022 (Originally 01/04/1985)   HIV Screening  10/20/2022 (Originally 01/04/1982)   INFLUENZA VACCINE  11/07/2022 (Originally 03/09/2022)   Zoster Vaccines- Shingrix (1 of 2) 12/15/2022 (Originally 01/04/2017)   COVID-19 Vaccine (3 - Booster for Janssen series) 06/14/2023 (Originally 08/30/2020)   COLONOSCOPY (Pts 45-19yr Insurance coverage will need to be confirmed)  08/09/2026   TETANUS/TDAP  10/11/2030   HPV VACCINES  Aged Out     ----------------------------------------------------------------------------------------------------------------------------------------------------------------------------------------------------------------- Physical Exam BP (!) 142/92 (BP Location: Left Arm, Patient Position: Sitting, Cuff Size: Large)   Pulse 95   Ht _0  (1.702 m)   Wt 299 lb (135.6 kg)   SpO2 96%   BMI 46.83 kg/m   Physical Exam  Constitutional:      Appearance: Normal appearance.  Eyes:     General: No scleral icterus. Cardiovascular:     Rate and Rhythm: Normal rate and regular rhythm.  Pulmonary:     Effort: Pulmonary effort is normal.     Breath sounds: Normal breath sounds.  Musculoskeletal:     Cervical back: Neck supple.  Neurological:     General: No focal deficit present.     Mental Status: He is alert and oriented to person, place, and time.      ------------------------------------------------------------------------------------------------------------------------------------------------------------------------------------------------------------------- Assessment and Plan  Prediabetes He is doing well with Ozempic at current strength.  Weight is down about 10 pounds since starting.  Encouraged continued dietary changes as well as incorporation of regular exercise to maximize weight loss.  Body mass index (BMI) 45.0-49.9, adult (HCC) Weight is down some.  He will continue Ozempic at current strength.  He is considering Roux-en-Y gastric bypass.  Low testosterone in male Doing pretty well with current dose of testosterone although he is unsure if he has noticed significant changes with this.  Chronic intermittent hypoxia with obstructive sleep apnea Is on CPAP.  Tolerating this well with significant improvement in symptoms.   Meds ordered this encounter  Medications   Semaglutide, 2 MG/DOSE, 8 MG/3ML SOPN    Sig: Inject 2 mg as directed once a week.    Dispense:  9 mL    Refill:  1    Return in about 4 months (around 10/07/2022) for F/u glucose/weight.    This visit occurred during the SARS-CoV-2 public health emergency.  Safety protocols were in place, including screening questions prior to the visit, additional usage of staff PPE, and extensive cleaning of exam room while observing appropriate contact time as indicated for disinfecting solutions.

## 2022-06-07 NOTE — Assessment & Plan Note (Signed)
Doing pretty well with current dose of testosterone although he is unsure if he has noticed significant changes with this.

## 2022-06-07 NOTE — Assessment & Plan Note (Signed)
Is on CPAP.  Tolerating this well with significant improvement in symptoms.

## 2022-06-07 NOTE — Assessment & Plan Note (Signed)
Weight is down some.  He will continue Ozempic at current strength.  He is considering Roux-en-Y gastric bypass.

## 2022-06-07 NOTE — Assessment & Plan Note (Signed)
He is doing well with Ozempic at current strength.  Weight is down about 10 pounds since starting.  Encouraged continued dietary changes as well as incorporation of regular exercise to maximize weight loss.

## 2022-06-11 ENCOUNTER — Ambulatory Visit (INDEPENDENT_AMBULATORY_CARE_PROVIDER_SITE_OTHER): Payer: BC Managed Care – PPO | Admitting: Family Medicine

## 2022-06-11 VITALS — BP 140/80 | HR 85

## 2022-06-11 DIAGNOSIS — E291 Testicular hypofunction: Secondary | ICD-10-CM

## 2022-06-11 MED ORDER — TESTOSTERONE CYPIONATE 200 MG/ML IM SOLN
200.0000 mg | Freq: Once | INTRAMUSCULAR | Status: AC
  Administered 2022-06-11: 200 mg via INTRAMUSCULAR

## 2022-06-11 NOTE — Progress Notes (Signed)
Medical screening examination/treatment was performed by qualified clinical staff member and as supervising physician I was immediately available for consultation/collaboration. I have reviewed documentation and agree with assessment and plan.  Tahiri Shareef, DO  

## 2022-06-11 NOTE — Progress Notes (Signed)
Pt here for testosterone injection no SOB,CP or mood swings. Injection tolerated well given in LUOQ. Pt to RTC in 2 weeks for next injection  

## 2022-06-17 DIAGNOSIS — R7303 Prediabetes: Secondary | ICD-10-CM | POA: Diagnosis not present

## 2022-06-17 DIAGNOSIS — R7989 Other specified abnormal findings of blood chemistry: Secondary | ICD-10-CM | POA: Diagnosis not present

## 2022-06-17 DIAGNOSIS — E559 Vitamin D deficiency, unspecified: Secondary | ICD-10-CM | POA: Diagnosis not present

## 2022-06-25 ENCOUNTER — Ambulatory Visit (INDEPENDENT_AMBULATORY_CARE_PROVIDER_SITE_OTHER): Payer: BC Managed Care – PPO | Admitting: Family Medicine

## 2022-06-25 VITALS — BP 129/86 | HR 90 | Ht 67.0 in

## 2022-06-25 DIAGNOSIS — R7989 Other specified abnormal findings of blood chemistry: Secondary | ICD-10-CM

## 2022-06-25 DIAGNOSIS — E291 Testicular hypofunction: Secondary | ICD-10-CM

## 2022-06-25 NOTE — Progress Notes (Unsigned)
   Established Patient Office Visit  Subjective   Patient ID: William Rose, male    DOB: November 06, 1965  Age: 56 y.o. MRN: [id number]  Chief Complaint  Patient presents with   testosterone injection    Nurse visit     HPI  Testosterone injeciton - assisted by interpreter rafael # [phone] -nurse visit - patient denies  chest pain, shortness of breath, mood swings,  palpitations or problems with medication.   ROS    Objective:     BP 129/86   Pulse 90   Ht 5\' 7"  (1.702 m)   SpO2 97%   BMI 46.83 kg/m  {Vitals History (Optional):23777}  Physical Exam   No results found for any visits on 06/25/22.  {Labs (Optional):23779}  The 10-year ASCVD risk score (Arnett DK, et al., 2019) is: 5.9%    Assessment & Plan:  Testosterone injection- administered injection-patient tolerated injection well without complications. Was assisted by interpreter rafael # [phone].  Problem List Items Addressed This Visit   None Visit Diagnoses     Male hypogonadism [E29.1]    -  Primary       Return in about 2 weeks (around 07/09/2022) for return in 2 weeks for nurse visit for testosterone injection.    14/08/2021, LPN

## 2022-06-25 NOTE — Patient Instructions (Signed)
Return in 14 days for nurse visit for testosterone injection. 

## 2022-06-27 NOTE — Progress Notes (Signed)
Medical screening examination/treatment was performed by qualified clinical staff member and as supervising physician I was immediately available for consultation/collaboration. I have reviewed documentation and agree with assessment and plan.  Rosette Bellavance, DO  

## 2022-06-29 ENCOUNTER — Telehealth: Payer: Self-pay

## 2022-06-29 NOTE — Telephone Encounter (Signed)
Initiated Prior authorization [id number] (2 MG/DOSE) 8MG /3ML pen-injectors Via: Covermymeds Case/Key:[id number] Status: approved  as of 06/29/22 Reason:Effective from 06/16/2022 through 06/15/2023. Notified Pt via: Mychart

## 2022-07-09 ENCOUNTER — Ambulatory Visit: Payer: BC Managed Care – PPO

## 2022-07-09 DIAGNOSIS — E291 Testicular hypofunction: Secondary | ICD-10-CM

## 2022-07-12 ENCOUNTER — Encounter: Payer: Self-pay | Admitting: Family Medicine

## 2022-07-12 ENCOUNTER — Encounter: Payer: Self-pay | Admitting: Neurology

## 2022-07-16 ENCOUNTER — Ambulatory Visit: Payer: BC Managed Care – PPO

## 2022-08-20 ENCOUNTER — Encounter: Payer: BC Managed Care – PPO | Attending: General Surgery | Admitting: Dietician

## 2022-08-20 ENCOUNTER — Encounter: Payer: Self-pay | Admitting: Dietician

## 2022-08-20 VITALS — Ht 67.0 in | Wt 301.9 lb

## 2022-08-20 DIAGNOSIS — E669 Obesity, unspecified: Secondary | ICD-10-CM | POA: Diagnosis not present

## 2022-08-20 IMAGING — DX DG FOOT COMPLETE 3+V*L*
3 series · 3 of 3 positions shown · non-contrast
Comparison: None Available.

CLINICAL DATA: Left second MTP joint plantar aspect pain with
metatarsalgia, with posterior right heel pain.

EXAM:
LEFT FOOT - COMPLETE 3+ VIEW; RIGHT OS CALCIS - 2+ VIEW

[foot ap]
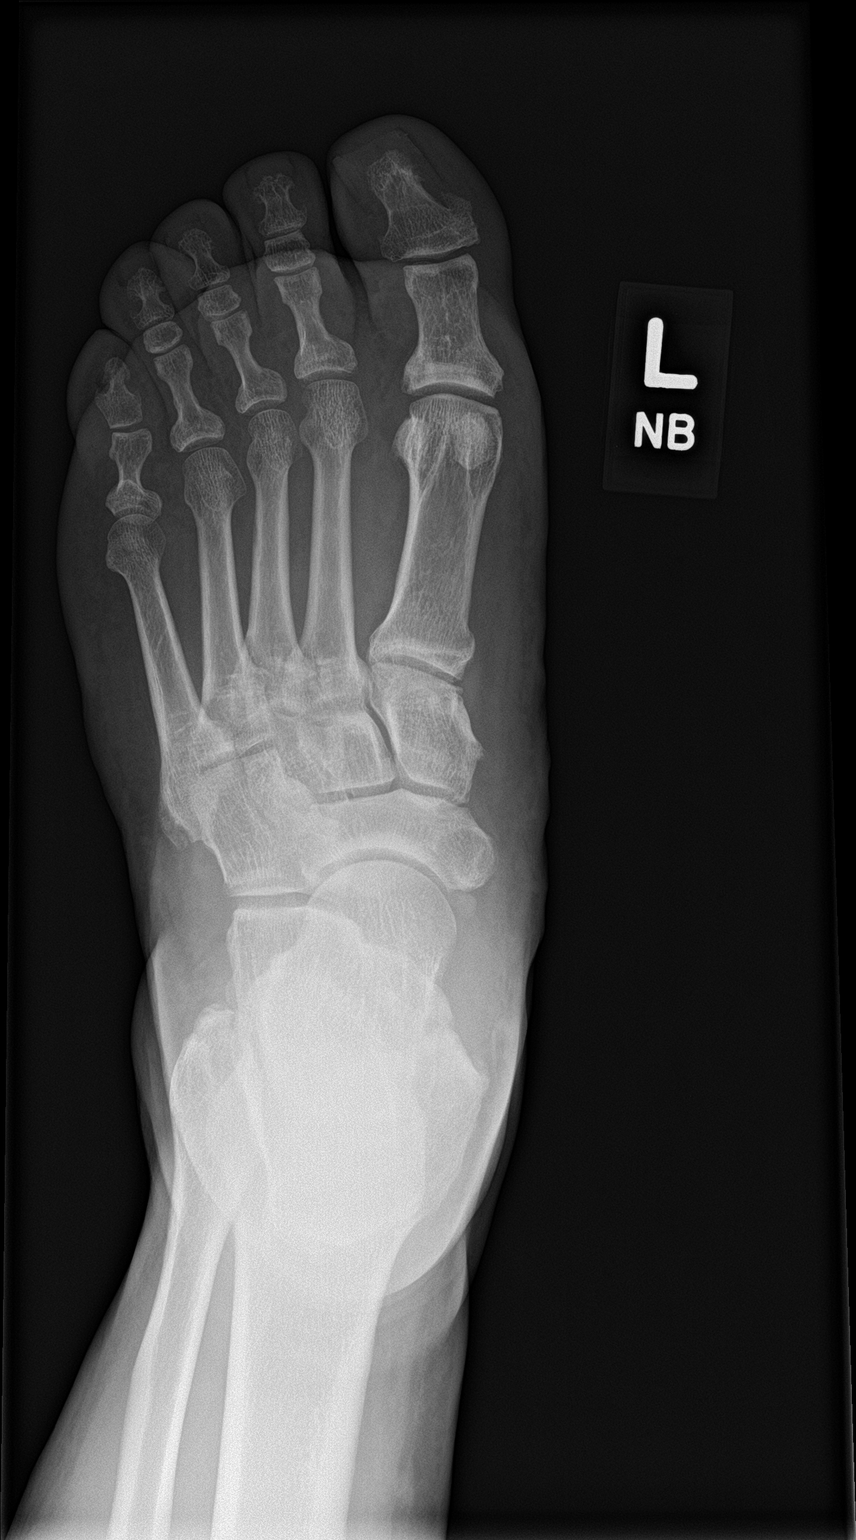

[foot obl]
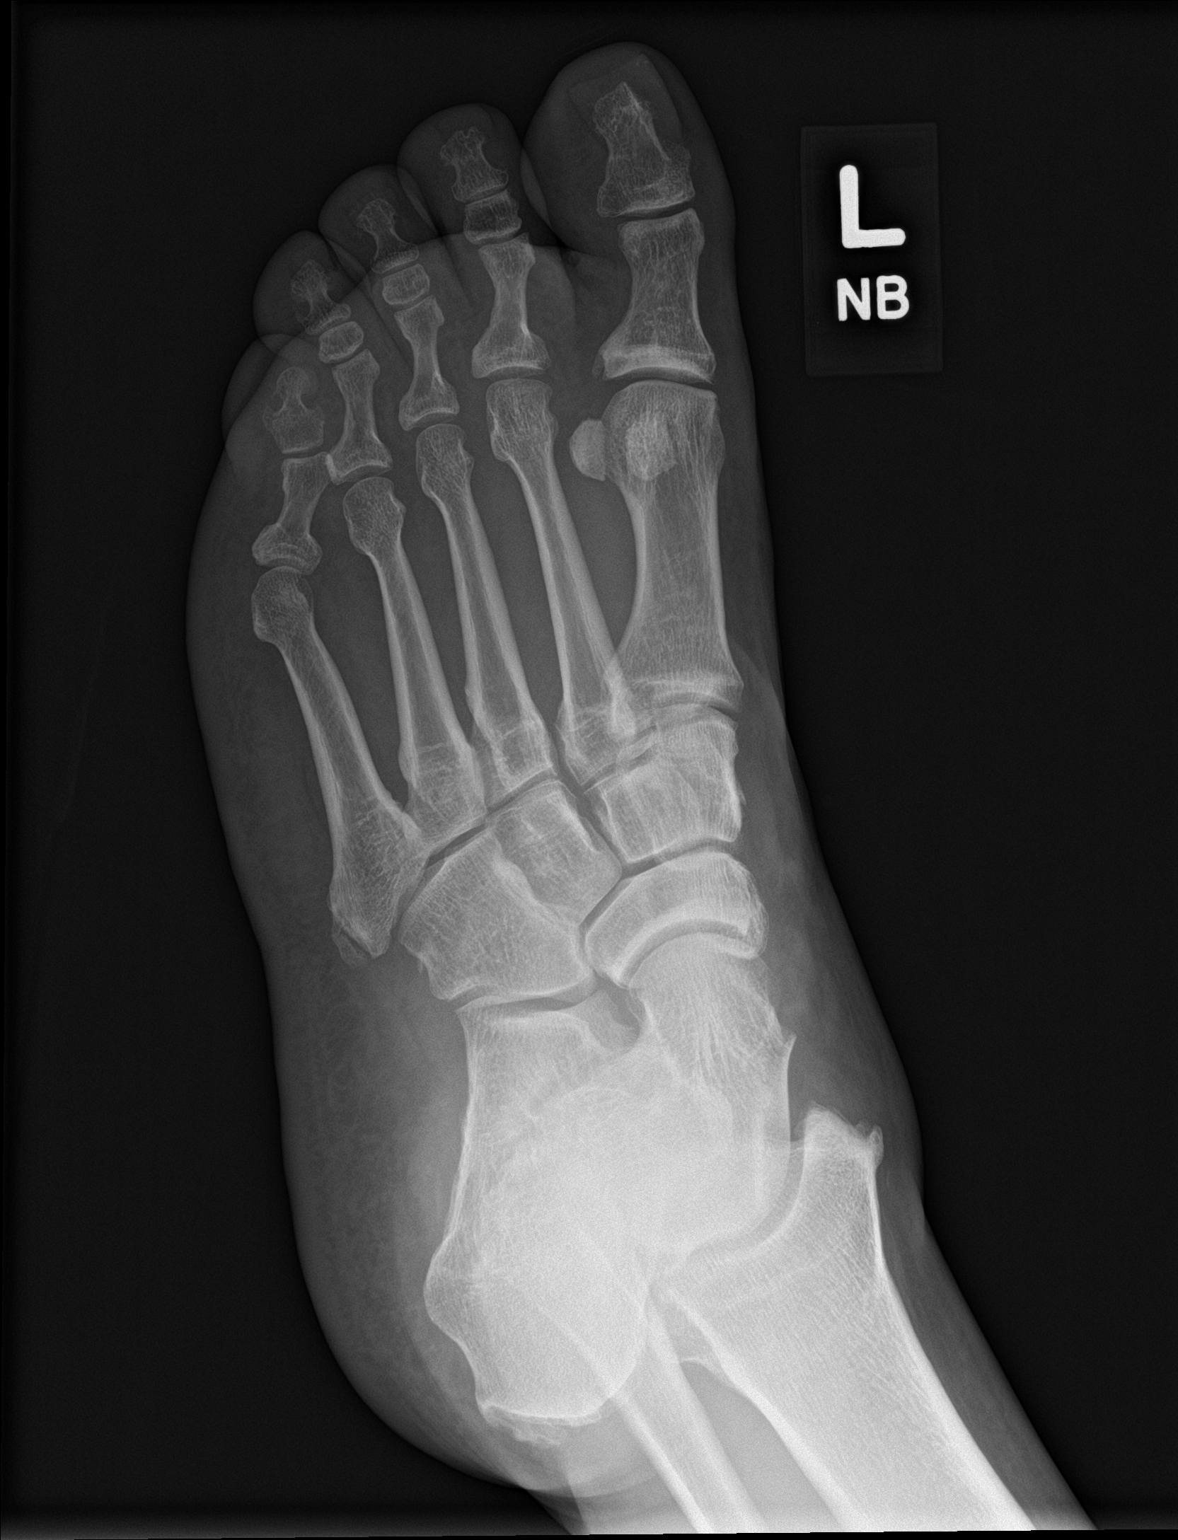

[foot lat]
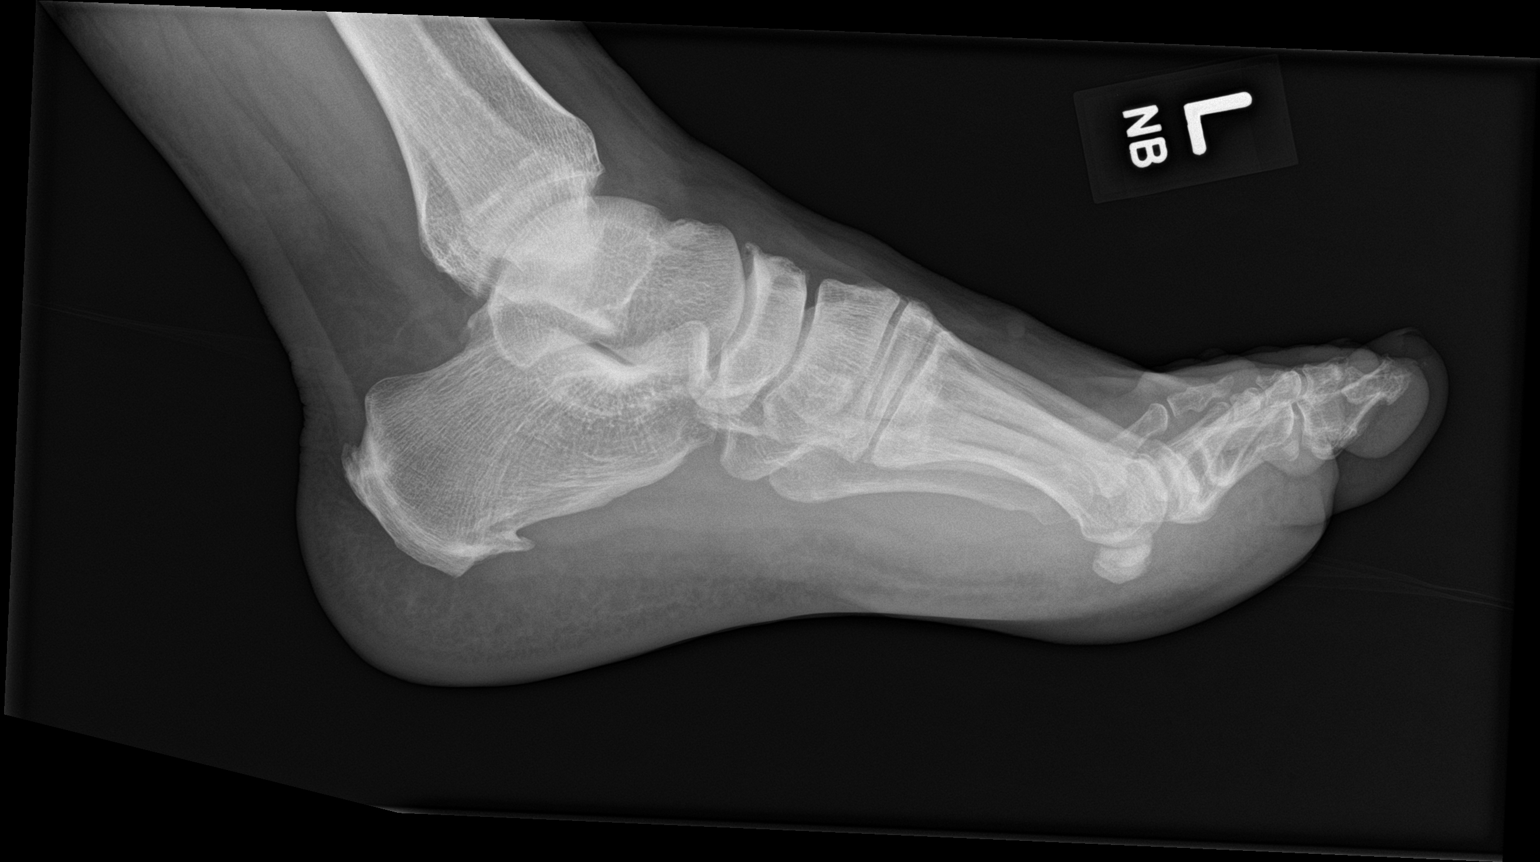

[3 of 3 positions shown; findings below may reference images not displayed]

FINDINGS: Right os calcis:

There is normal bone mineralization without evidence of fractures or
dislocation.

There are moderately prominent plantar calcaneal spurs present
without evidence of spur erosion or fracture, with a small posterior
calcaneal enthesophyte at the Achilles insertion.

There are mild features of arthrosis of the superior midfoot on
lateral view. There is no erosive arthropathy.

The subtalar and calcaneocuboid joints are unremarkable on the
lateral view.

There is suspected at least mild thickening of the distal Achilles
soft tissue stripe which could be due to tendon trauma or tendinitis
or alternatively chronic tendinosis.

Left foot:

There is normal bone mineralization without evidence of fracture,
dislocation or arthritic change. Soft tissues are unremarkable.

There is enthesopathic spurring of the lateral base of fifth
metatarsal and small noninflammatory plantar and posterior calcaneal
enthesophytes.

There is enthesopathic spurring of the medial malleolus as well.
Small accessory navicular bone incidentally noted.
IMPRESSION: 1. Noninflammatory enthesopathic changes of both calcanei.
2. Asymmetry in the right Achilles soft tissue stripe which appears
thicker which could indicate posttraumatic or inflammatory tendon
thickening, chronicity indeterminate.
3. No evidence of fractures with incidental findings described
above.

## 2022-08-20 IMAGING — DX DG OS CALCIS 2+V*R*
3 series · 3 of 3 positions shown · non-contrast
Comparison: None Available.

CLINICAL DATA: Left second MTP joint plantar aspect pain with
metatarsalgia, with posterior right heel pain.

EXAM:
LEFT FOOT - COMPLETE 3+ VIEW; RIGHT OS CALCIS - 2+ VIEW

[calcaneus axial (1 of 2)]
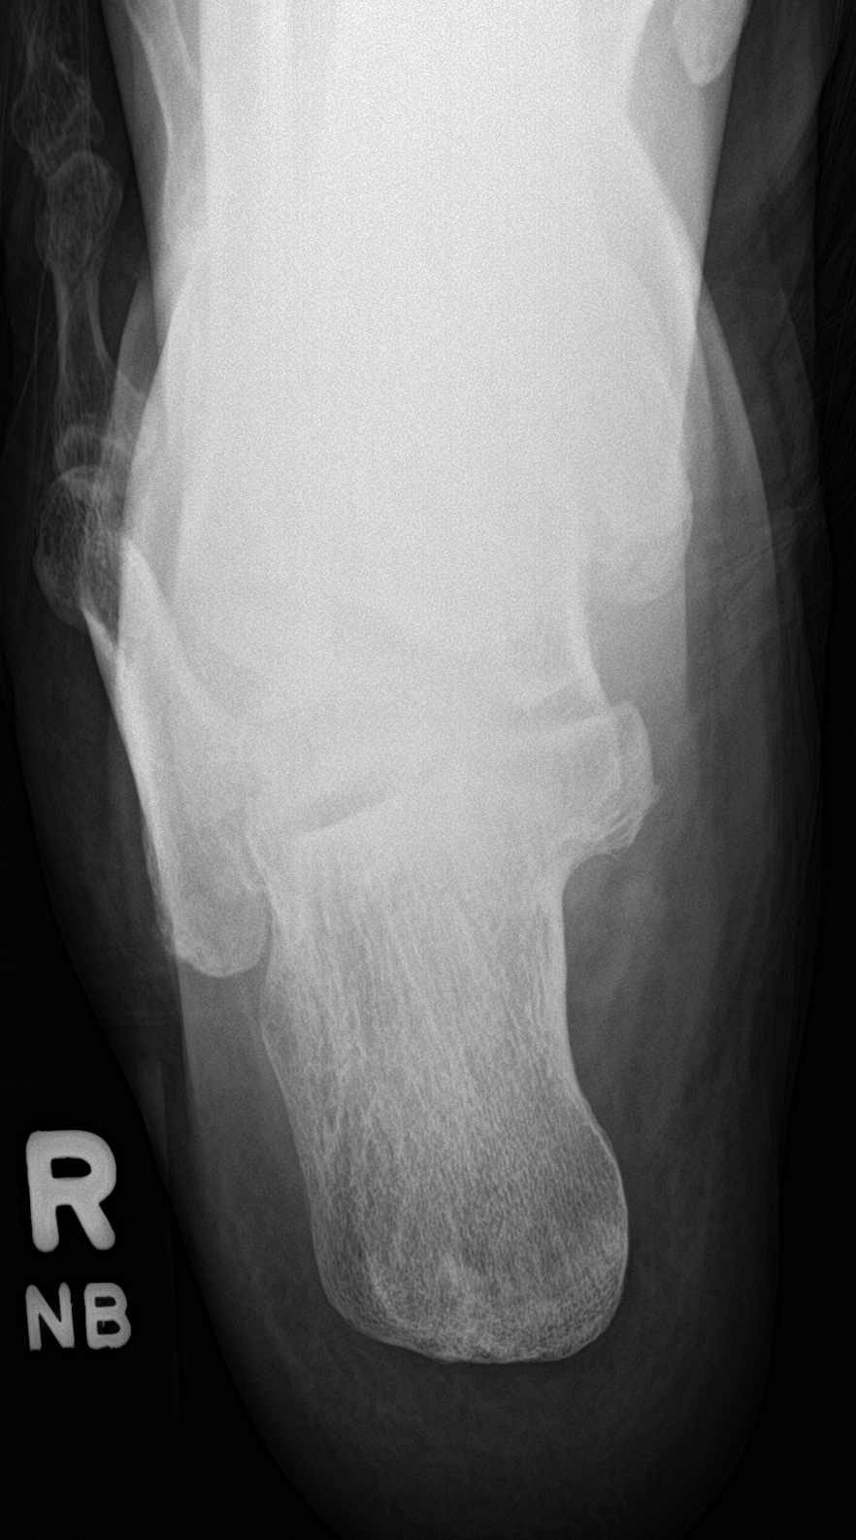

[calcaneus lat]
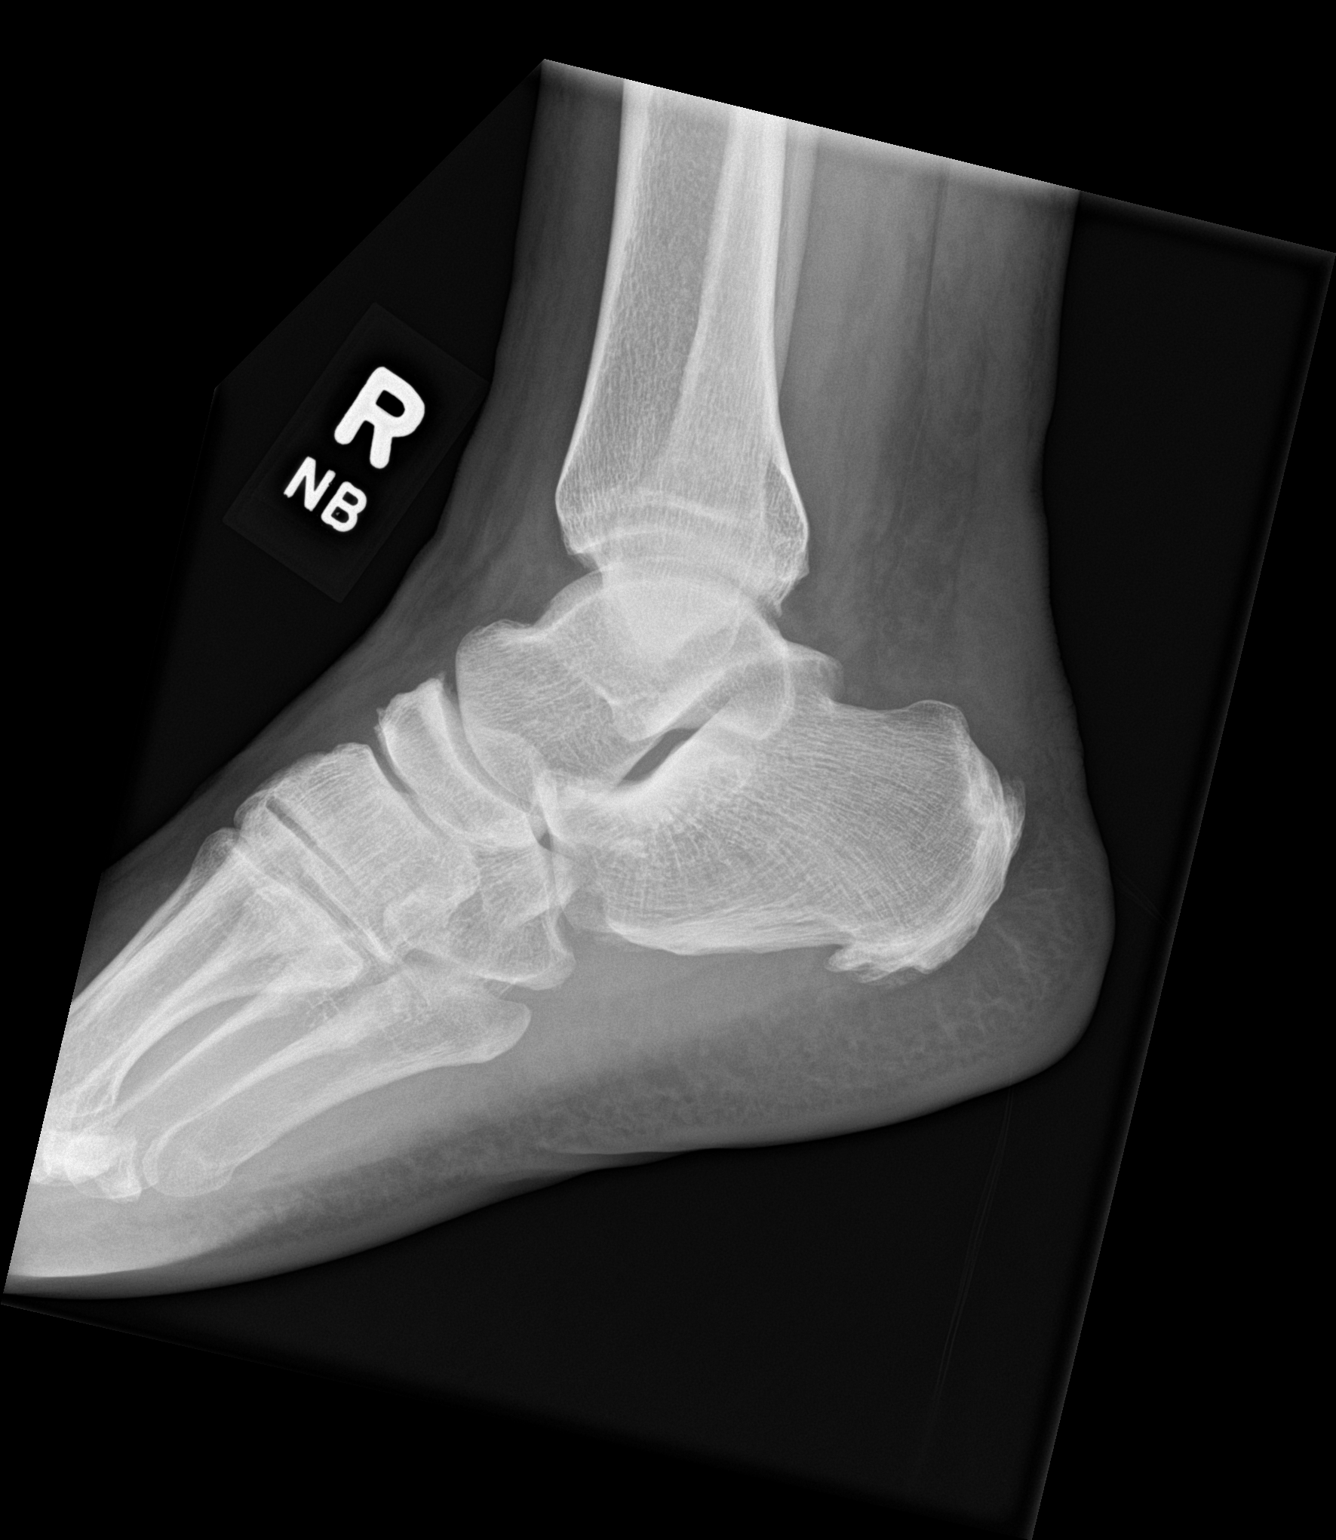

[calcaneus axial (2 of 2)]
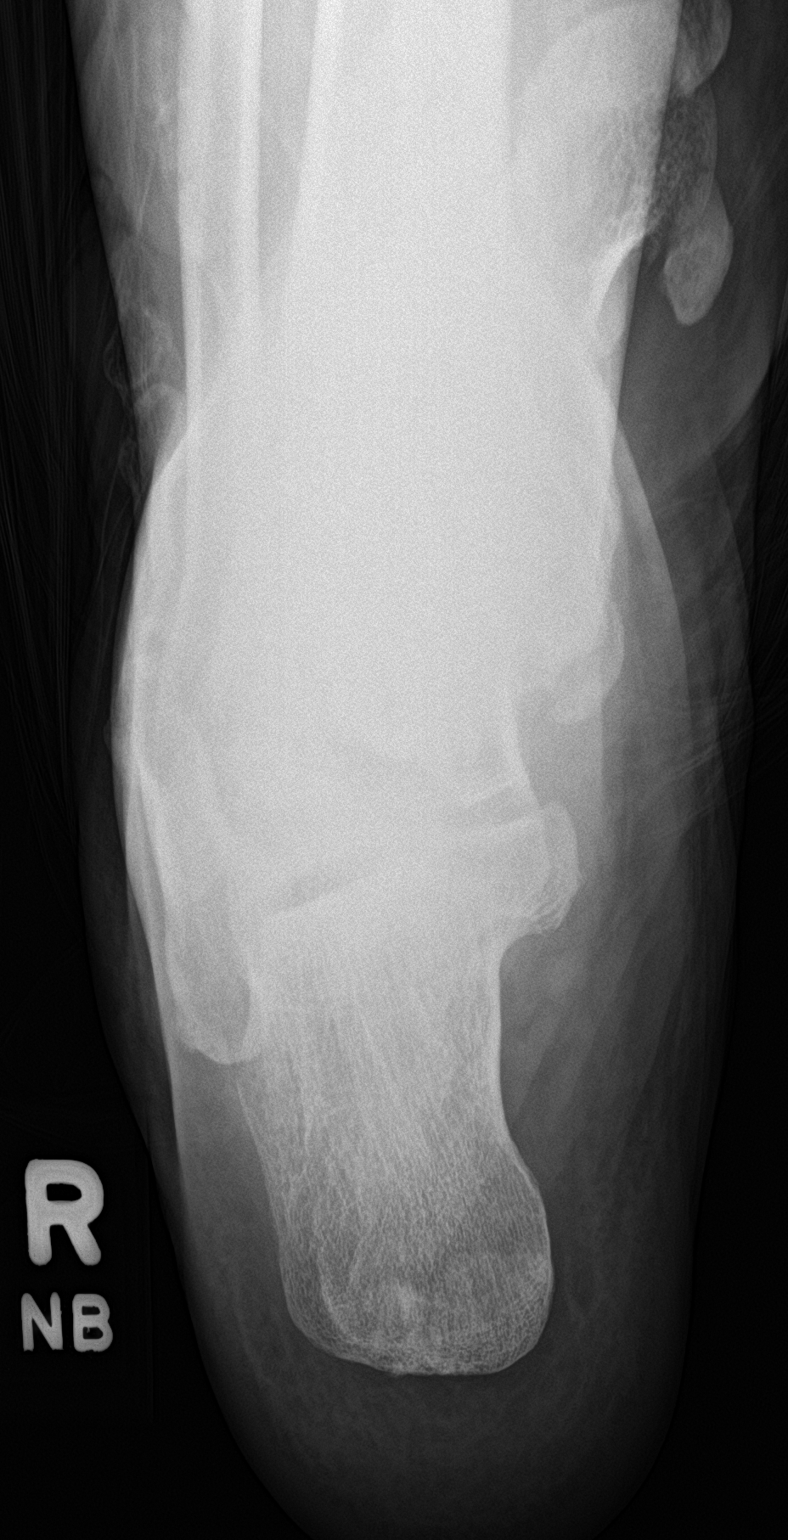

[3 of 3 positions shown; findings below may reference images not displayed]

FINDINGS: Right os calcis:

There is normal bone mineralization without evidence of fractures or
dislocation.

There are moderately prominent plantar calcaneal spurs present
without evidence of spur erosion or fracture, with a small posterior
calcaneal enthesophyte at the Achilles insertion.

There are mild features of arthrosis of the superior midfoot on
lateral view. There is no erosive arthropathy.

The subtalar and calcaneocuboid joints are unremarkable on the
lateral view.

There is suspected at least mild thickening of the distal Achilles
soft tissue stripe which could be due to tendon trauma or tendinitis
or alternatively chronic tendinosis.

Left foot:

There is normal bone mineralization without evidence of fracture,
dislocation or arthritic change. Soft tissues are unremarkable.

There is enthesopathic spurring of the lateral base of fifth
metatarsal and small noninflammatory plantar and posterior calcaneal
enthesophytes.

There is enthesopathic spurring of the medial malleolus as well.
Small accessory navicular bone incidentally noted.
IMPRESSION: 1. Noninflammatory enthesopathic changes of both calcanei.
2. Asymmetry in the right Achilles soft tissue stripe which appears
thicker which could indicate posttraumatic or inflammatory tendon
thickening, chronicity indeterminate.
3. No evidence of fractures with incidental findings described
above.

## 2022-08-20 NOTE — Progress Notes (Signed)
Pre-Operative Nutrition Class:    Patient was seen on 08/20/2022 for Pre-Operative Bariatric Surgery Education at the [facility].    Planned surgery: RYGB   NUTRITION ASSESSMENT   Anthropometrics  Start weight at [facility]: 300.0 lbs (date: 06/01/2022) or 136.3 kg Height: 67 in Weight today: 301.9 lbs. (136.9 kg) BMI: 47.28 kg/m2     Clinical  Medical hx: Obesity, sleep apnea Medications: Vit D, semaglutide   Labs: A1C 5.7; HDL 36; LDL 106 Notable signs/symptoms: nothing noted Any previous deficiencies? No  Samples given per MNT protocol. Patient educated on appropriate usage: [employer] Multivitamin Lot [id number] Exp:6/26   [place] Calcium  Lot #[id number] Exp: 03/04/23   Ensure Max Protein Shake Epimenio Sarin Lot # [id number] Exp: [id number]  The following the learning objectives were met by the patient during this course: Identify Pre-Op Dietary Goals and will begin 2 weeks pre-operatively Identify appropriate sources of fluids and proteins  State protein recommendations and appropriate sources pre and post-operatively Identify Post-Operative Dietary Goals and will follow for 2 weeks post-operatively Identify appropriate multivitamin and calcium sources Describe the need for physical activity post-operatively and will follow MD recommendations State when to call healthcare provider regarding medication questions or post-operative complications When having a diagnosis of diabetes understanding hypoglycemia symptoms and the inclusion of 1 complex carbohydrate per meal  Handouts given during class include: Pre-Op Bariatric Surgery Diet Handout Protein Shake Handout Post-Op Bariatric Surgery Nutrition Handout BELT Program Information Flyer Support Group Information Flyer [facility] Bariatric Supplements Price List  Follow-Up Plan: Patient will follow-up at [facility] 2 weeks post operatively for diet advancement per MD.

## 2022-10-04 LAB — COLOGUARD

## 2022-10-07 ENCOUNTER — Encounter: Payer: Self-pay | Admitting: Family Medicine

## 2022-10-07 ENCOUNTER — Ambulatory Visit: Payer: BC Managed Care – PPO | Admitting: Family Medicine

## 2022-10-07 VITALS — BP 144/84 | HR 81 | Ht 67.0 in | Wt 314.0 lb

## 2022-10-07 DIAGNOSIS — E662 Morbid (severe) obesity with alveolar hypoventilation: Secondary | ICD-10-CM | POA: Diagnosis not present

## 2022-10-07 DIAGNOSIS — R7989 Other specified abnormal findings of blood chemistry: Secondary | ICD-10-CM | POA: Diagnosis not present

## 2022-10-07 DIAGNOSIS — Z6841 Body Mass Index (BMI) 40.0 and over, adult: Secondary | ICD-10-CM

## 2022-10-07 NOTE — Patient Instructions (Signed)
I hope your surgery goes well! Let's follow up 1 month after you surgery.

## 2022-10-10 NOTE — Assessment & Plan Note (Signed)
Planning on having gastric bypass in a few weeks.  Currently off of semaglutide and has had some weight gain being off of this.

## 2022-10-10 NOTE — Progress Notes (Signed)
William Rose - 57 y.o. male MRN [id number]  Date of birth: [birth year]  Subjective Chief Complaint  Patient presents with   Weight Loss   Due to language barrier, an interpreter was present during the history-taking and subsequent discussion (and for part of the physical exam) with this patient.   HPI William Rose is a 57 y.o. male here today for follow-up visit.  Since his last visit with me he has had consultation with Dr. Redmond Pulling to discuss bariatric surgery.  He is waiting on having Roux-en-Y gastric bypass in a few weeks.  He has been off of semaglutide for few months.  He has gained weight since that time.  He is using CPAP machine regularly.  Energy levels remain moderately low.  Has continued on testosterone supplementation.  Doing well with this.  ROS:  A comprehensive ROS was completed and negative except as noted per HPI  No Known Allergies  History reviewed. No pertinent past medical history.  Past Surgical History:  Procedure Laterality Date   CARPAL TUNNEL RELEASE      Social History   Socioeconomic History   Marital status: Married    Spouse name: Venora Maples   Number of children: 4   Years of education: Not on file   Highest education level: Some college, no degree  Occupational History    Employer: [employer]  Tobacco Use   Smoking status: Former    Packs/day: 1.00    Years: 30.00    Total pack years: 30.00    Types: Cigarettes    Quit date: 08/09/2017    Years since quitting: 5.1   Smokeless tobacco: Never  Vaping Use   Vaping Use: Never used  Substance and Sexual Activity   Alcohol use: Never   Drug use: Never   Sexual activity: Yes    Partners: Female  Other Topics Concern   Not on file  Social History Narrative   Lives with wife   R handed   Caffeine: use to drink 16 C of coffee a day and now 8 coffee a day   Social Determinants of Health   Financial Resource Strain: Not on file  Food Insecurity: Not on file  Transportation Needs:  Not on file  Physical Activity: Not on file  Stress: Not on file  Social Connections: Not on file    Family History  Problem Relation Age of Onset   Epilepsy Mother     Health Maintenance  Topic Date Due   Lung Cancer Screening  Never done   Hepatitis C Screening  10/20/2022 (Originally 01/04/1985)   HIV Screening  10/20/2022 (Originally 01/04/1982)   INFLUENZA VACCINE  11/07/2022 (Originally 03/09/2022)   Zoster Vaccines- Shingrix (1 of 2) 12/15/2022 (Originally 01/04/2017)   COVID-19 Vaccine (3 - 2023-24 season) 06/14/2023 (Originally 04/09/2022)   COLONOSCOPY (Pts 45-65yr Insurance coverage will need to be confirmed)  08/09/2026   DTaP/Tdap/Td (2 - Td or Tdap) 10/11/2030   HPV VACCINES  Aged Out     ----------------------------------------------------------------------------------------------------------------------------------------------------------------------------------------------------------------- Physical Exam BP (!) 144/84 (BP Location: Left Arm, Patient Position: Sitting, Cuff Size: Large)   Pulse 81   Ht '5\' 7"'$  (1.702 m)   Wt (!) 314 lb (142.4 kg)   SpO2 99%   BMI 49.18 kg/m   Physical Exam Constitutional:      Appearance: Normal appearance.  HENT:     Head: Normocephalic and atraumatic.  Eyes:     General: No scleral icterus. Cardiovascular:     Rate and Rhythm:  Normal rate and regular rhythm.  Pulmonary:     Effort: Pulmonary effort is normal.     Breath sounds: Normal breath sounds.  Neurological:     Mental Status: He is alert.  Psychiatric:        Mood and Affect: Mood normal.        Behavior: Behavior normal.     ------------------------------------------------------------------------------------------------------------------------------------------------------------------------------------------------------------------- Assessment and Plan  Class 3 obesity with alveolar hypoventilation without serious comorbidity with body mass index (BMI) of  45.0 to 49.9 in adult [facility]) Planning on having gastric bypass in a few weeks.  Currently off of semaglutide and has had some weight gain being off of this.  Low testosterone in male He will continue testosterone therapy at current strength.  Will plan to follow-up 1 month after his surgery.   No orders of the defined types were placed in this encounter.   Return in about 7 weeks (around 11/25/2022) for f/u after gastric bypass surgery/Updated testosterone.    This visit occurred during the SARS-CoV-2 public health emergency.  Safety protocols were in place, including screening questions prior to the visit, additional usage of staff PPE, and extensive cleaning of exam room while observing appropriate contact time as indicated for disinfecting solutions.

## 2022-10-10 NOTE — Assessment & Plan Note (Signed)
He will continue testosterone therapy at current strength.  Will plan to follow-up 1 month after his surgery.

## 2022-10-14 DIAGNOSIS — R7989 Other specified abnormal findings of blood chemistry: Secondary | ICD-10-CM | POA: Diagnosis not present

## 2022-10-14 DIAGNOSIS — R7303 Prediabetes: Secondary | ICD-10-CM | POA: Diagnosis not present

## 2022-10-14 DIAGNOSIS — E786 Lipoprotein deficiency: Secondary | ICD-10-CM | POA: Diagnosis not present

## 2022-10-15 NOTE — Progress Notes (Signed)
Sent message, via epic in basket, requesting orders in epic from surgeon.  

## 2022-10-17 ENCOUNTER — Ambulatory Visit: Payer: Self-pay | Admitting: General Surgery

## 2022-10-17 DIAGNOSIS — Z1211 Encounter for screening for malignant neoplasm of colon: Secondary | ICD-10-CM | POA: Diagnosis not present

## 2022-10-17 DIAGNOSIS — R7303 Prediabetes: Secondary | ICD-10-CM

## 2022-10-17 DIAGNOSIS — E611 Iron deficiency: Secondary | ICD-10-CM

## 2022-10-19 ENCOUNTER — Encounter: Payer: Self-pay | Admitting: Family Medicine

## 2022-10-21 NOTE — Progress Notes (Addendum)
Spanish interpreter used during appointment  COVID Vaccine Completed: yes  Date of COVID positive in last 90 days: no  PCP - Luetta Nutting, DO Cardiologist - n/a  Chest x-ray - 05/21/22 Epic EKG - 05/21/22 Epic Stress Test - n/a ECHO - n/a Cardiac Cath - n/a Pacemaker/ICD device last checked: n/a Spinal Cord Stimulator: n/a  Bowel Prep - no solids after 6pm  Sleep Study - yes CPAP -  yes every night   Fasting Blood Sugar - no, took Ozempic for weight lose, has not taken since December  Checks Blood Sugar   Last dose of GLP1 agonist-  N/A GLP1 instructions:  N/A   Last dose of SGLT-2 inhibitors-  N/A SGLT-2 instructions: N/A  Blood Thinner Instructions: n/a Aspirin Instructions: Last Dose:  Activity level: Can go up a flight of stairs and perform activities of daily living without stopping and without symptoms of chest pain or shortness of breath.  Anesthesia review:   Patient denies shortness of breath, fever, cough and chest pain at PAT appointment  Patient verbalized understanding of instructions that were given to them at the PAT appointment. Patient was also instructed that they will need to review over the PAT instructions again at home before surgery.

## 2022-10-22 ENCOUNTER — Other Ambulatory Visit: Payer: Self-pay

## 2022-10-22 ENCOUNTER — Encounter (HOSPITAL_COMMUNITY): Payer: Self-pay

## 2022-10-22 ENCOUNTER — Encounter (HOSPITAL_COMMUNITY)
Admission: RE | Admit: 2022-10-22 | Discharge: 2022-10-22 | Disposition: A | Discharge status: Home / Self Care | Payer: BC Managed Care – PPO | Source: Ambulatory Visit | Attending: General Surgery | Admitting: General Surgery

## 2022-10-22 VITALS — BP 153/91 | HR 81 | Resp 16 | Ht 67.72 in | Wt 307.0 lb

## 2022-10-22 DIAGNOSIS — Z833 Family history of diabetes mellitus: Secondary | ICD-10-CM | POA: Diagnosis not present

## 2022-10-22 DIAGNOSIS — R7303 Prediabetes: Secondary | ICD-10-CM | POA: Diagnosis not present

## 2022-10-22 DIAGNOSIS — E559 Vitamin D deficiency, unspecified: Secondary | ICD-10-CM | POA: Diagnosis not present

## 2022-10-22 DIAGNOSIS — Z01812 Encounter for preprocedural laboratory examination: Secondary | ICD-10-CM | POA: Insufficient documentation

## 2022-10-22 DIAGNOSIS — E611 Iron deficiency: Secondary | ICD-10-CM | POA: Insufficient documentation

## 2022-10-22 DIAGNOSIS — Z9884 Bariatric surgery status: Secondary | ICD-10-CM | POA: Diagnosis not present

## 2022-10-22 DIAGNOSIS — E785 Hyperlipidemia, unspecified: Secondary | ICD-10-CM | POA: Diagnosis not present

## 2022-10-22 DIAGNOSIS — K760 Fatty (change of) liver, not elsewhere classified: Secondary | ICD-10-CM | POA: Diagnosis not present

## 2022-10-22 DIAGNOSIS — K8020 Calculus of gallbladder without cholecystitis without obstruction: Secondary | ICD-10-CM | POA: Diagnosis not present

## 2022-10-22 DIAGNOSIS — G4733 Obstructive sleep apnea (adult) (pediatric): Secondary | ICD-10-CM | POA: Diagnosis not present

## 2022-10-22 DIAGNOSIS — Z6841 Body Mass Index (BMI) 40.0 and over, adult: Secondary | ICD-10-CM | POA: Diagnosis not present

## 2022-10-22 DIAGNOSIS — Z87891 Personal history of nicotine dependence: Secondary | ICD-10-CM | POA: Diagnosis not present

## 2022-10-22 HISTORY — DX: Headache, unspecified: R51.9

## 2022-10-22 HISTORY — DX: Sleep apnea, unspecified: G47.30

## 2022-10-22 LAB — COMPREHENSIVE METABOLIC PANEL
ALT: 22 U/L (ref 0–44)
AST: 22 U/L (ref 15–41)
Albumin: 4.2 g/dL (ref 3.5–5.0)
Alkaline Phosphatase: 42 U/L (ref 38–126)
Anion gap: 8 (ref 5–15)
BUN: 19 mg/dL (ref 6–20)
CO2: 25 mmol/L (ref 22–32)
Calcium: 8.6 mg/dL — ABNORMAL LOW (ref 8.9–10.3)
Chloride: 104 mmol/L (ref 98–111)
Creatinine, Ser: 0.82 mg/dL (ref 0.61–1.24)
GFR, Estimated: >60 mL/min (ref >60)
Glucose, Bld: 166 mg/dL — ABNORMAL HIGH (ref 70–99)
Potassium: 3.8 mmol/L (ref 3.5–5.1)
Sodium: 137 mmol/L (ref 135–145)
Total Bilirubin: 1.1 mg/dL (ref 0.3–1.2)
Total Protein: 6.8 g/dL (ref 6.5–8.1)

## 2022-10-22 LAB — CBC WITH DIFFERENTIAL/PLATELET
Abs Immature Granulocytes: 0.02 10*3/uL (ref 0.00–0.07)
Basophils Absolute: 0 10*3/uL (ref 0.0–0.1)
Basophils Relative: 0 %
Eosinophils Absolute: 0.1 10*3/uL (ref 0.0–0.5)
Eosinophils Relative: 2 %
HCT: 45.2 % (ref 39.0–52.0)
Hemoglobin: 14.9 g/dL (ref 13.0–17.0)
Immature Granulocytes: 0 %
Lymphocytes Relative: 33 %
Lymphs Abs: 2.4 10*3/uL (ref 0.7–4.0)
MCH: 29.3 pg (ref 26.0–34.0)
MCHC: 33 g/dL (ref 30.0–36.0)
MCV: 88.8 fL (ref 80.0–100.0)
Monocytes Absolute: 0.4 10*3/uL (ref 0.1–1.0)
Monocytes Relative: 5 %
Neutro Abs: 4.4 10*3/uL (ref 1.7–7.7)
Neutrophils Relative %: 60 %
Platelets: 292 10*3/uL (ref 150–400)
RBC: 5.09 MIL/uL (ref 4.22–5.81)
RDW: 14.4 % (ref 11.5–15.5)
WBC: 7.4 10*3/uL (ref 4.0–10.5)
nRBC: 0 % (ref 0.0–0.2)

## 2022-10-22 NOTE — Patient Instructions (Addendum)
SURGICAL WAITING ROOM VISITATION  Patients having surgery or a procedure may have no more than 2 support people in the waiting area - these visitors may rotate.    Children under the age of 73 must have an adult with them who is not the patient.  Due to an increase in RSV and influenza rates and associated hospitalizations, children ages 65 and under may not visit patients in [place].  If the patient needs to stay at the hospital during part of their recovery, the visitor guidelines for inpatient rooms apply. Pre-op nurse will coordinate an appropriate time for 1 support person to accompany patient in pre-op.  This support person may not rotate.    Please refer to the [facility] website for the visitor guidelines for Inpatients (after your surgery is over and you are in a regular room).    Your procedure is scheduled on: 10/25/22   Report to [facility] Main Entrance    Report to admitting at 5:15 AM   Call this number if you have problems the morning of surgery [phone]   MORNING OF SURGERY DRINK:   DRINK 1 G2 drink BEFORE YOU LEAVE HOME, DRINK ALL OF THE  G2 DRINK AT ONE TIME.   NO SOLID FOOD AFTER 600 PM THE NIGHT BEFORE YOUR SURGERY. YOU MAY DRINK CLEAR FLUIDS. THE G2 DRINK YOU DRINK BEFORE YOU LEAVE HOME WILL BE THE LAST FLUIDS YOU DRINK BEFORE SURGERY.  PAIN IS EXPECTED AFTER SURGERY AND WILL NOT BE COMPLETELY ELIMINATED. AMBULATION AND TYLENOL WILL HELP REDUCE INCISIONAL AND GAS PAIN. MOVEMENT IS KEY!  YOU ARE EXPECTED TO BE OUT OF BED WITHIN 4 HOURS OF ADMISSION TO YOUR PATIENT ROOM.  SITTING IN THE RECLINER THROUGHOUT THE DAY IS IMPORTANT FOR DRINKING FLUIDS AND MOVING GAS THROUGHOUT THE GI TRACT.  COMPRESSION STOCKINGS SHOULD BE WORN [place] UNLESS YOU ARE WALKING.   INCENTIVE SPIROMETER SHOULD BE USED EVERY HOUR WHILE AWAKE TO DECREASE POST-OPERATIVE COMPLICATIONS SUCH AS PNEUMONIA.  WHEN DISCHARGED HOME, IT IS IMPORTANT TO  CONTINUE TO WALK EVERY HOUR AND USE THE INCENTIVE SPIROMETER EVERY HOUR.      You may have the following liquids until 4:30 AM DAY OF SURGERY  Water Non-Citrus Juices (without pulp, NO RED-Apple, White grape, White cranberry) Black Coffee (NO MILK/CREAM OR CREAMERS, sugar ok)  Clear Tea (NO MILK/CREAM OR CREAMERS, sugar ok) regular and decaf                             Plain Jell-O (NO RED)                                           Fruit ices (not with fruit pulp, NO RED)                                     Popsicles (NO RED)                                                               Sports drinks like  Gatorade (NO RED)                 The day of surgery:  Drink ONE (1) Pre-Surgery G2 at 4:30 AM the morning of surgery. Drink in one sitting. Do not sip.  This drink was given to you during your hospital  pre-op appointment visit. Nothing else to drink after completing the  Pre-Surgery G2.          If you have questions, please contact your surgeon's office.   FOLLOW BOWEL PREP AND ANY ADDITIONAL PRE OP INSTRUCTIONS YOU RECEIVED FROM YOUR SURGEON'S OFFICE!!!     Oral Hygiene is also important to reduce your risk of infection.                                    Remember - BRUSH YOUR TEETH THE MORNING OF SURGERY WITH YOUR REGULAR TOOTHPASTE  DENTURES WILL BE REMOVED PRIOR TO SURGERY PLEASE DO NOT APPLY "Poly grip" OR ADHESIVES!!!   Take these medicines the morning of surgery with A SIP OF WATER: None  Bring CPAP mask and tubing day of surgery.                              You may not have any metal on your body including jewelry, and body piercing             Do not wear lotions, powders, cologne, or deodorant  Do not shave  48 hours prior to surgery.               Men may shave face and neck.   Do not bring valuables to the hospital. [place].   Contacts, glasses, dentures or bridgework may not be worn into  surgery.   Bring small overnight bag day of surgery.   DO NOT [place]. PHARMACY WILL DISPENSE MEDICATIONS LISTED ON YOUR MEDICATION LIST TO YOU DURING YOUR ADMISSION [place]!   Special Instructions: Bring a copy of your healthcare power of attorney and living will documents the day of surgery if you haven't scanned them before.              Please read over the following fact sheets you were given: IF YOU HAVE QUESTIONS ABOUT YOUR PRE-OP INSTRUCTIONS PLEASE CALL [phone]- Jim Lundin   DEBIDO AL COVID-19 SLO SE PERMITEN DOS VISITANTES (de 16 aos en adelante)  PARA QUE VENGAN CON USTED Y SE QUEDEN EN LA SALA DE ESPERA SOLAMENTE DURANTE EL PRE OP Y EL PROCEDIMIENTO.   **NO SE PERMITEN VISITAS EN EL REA DE CORTA ESTADA NI EN LA SALA DE RECUPERACIN!!**  SI VA A SER INGRESADO(A) AL HOSPITAL SLO SE LE PERMITEN CUATRO PERSONAS DE APOYO DURANTE LAS HORAS DE VISITA (7 AM -8PM)   La(s) persona(s) de apoyo debe(n) pasar nuestra evaluacin, entrar y salir con gel y Teaching laboratory technician en todo momento, incluso en la habitacin del Luis Llorons Torres. Los pacientes tambin deben usar una mscara cuando el personal o su persona de apoyo estn en la habitacin. Los visitantes DEBEN LLEVAR ETIQUETA DE VISITANTE DE UNA MANERA VISIBLE. Un visitante adulto International aid/development worker con usted durante la noche y DEBE estar en la habitacin a las 8 P.M.  Su procedimiento est programado en: 10/25/22   Presntese a la entrada [place] a admisiones por la maana 5:15AM   Llame a este nmero si tiene [employer] maana de la ciruga al [phone]   No consuma alimentos: Despus de la 6pm night before   Despus de la medianoche puede tomar los siguientes lquidos [employer] la(s) 4:30 AM DEL DA DE LA CIRUGA  Agua Caf negro (con azcar, SIN [place], NI CREMA)  T normal y descafeinado (con azcar, SIN LECHE, NI CREMA)                               Gelatina normal (NO ROJA)                                           Helados de frutas (sin pulpa. NO DE COLOR ROJO)                                     Helados de hielo (NO ROJO)                                                                  Jugo: de manzana, uva [place], arndano BLANCO Bebidas deportivas como Gatorade (NO ROJAS)     El da de la ciruga:  [employer] (1) Ensure transparente antes de la ciruga o G2 en 4:30 LA [place] de la ciruga. Tmeselo de un solo. No se lo tome de a sorbitos.  Esta bebida se le dio durante su cita preoperatoria en el hospital. No tome nada ms despus de tomarse todo G2 transparente antes de la [place].          Si tiene preguntas, por favor. Pngase en contacto con la oficina de su cirujano.   SIGA LA PREPARACIN INTESTINAL Y CUALQUIER INSTRUCCIN ADICIONAL PREOPERATORIA QUE HAYA RECIBIDO DEL OFICINA DE DU CIRUJANO!!!     La higiene bucal tambin es importante para reducir el riesgo de infeccin.                                   Recuerde - LVESE LOS DIENTES EN LA MAANA DE LA CIRUGA CON SU PASTA DENTAL HABITUAL   Tome estos medicamentos en la maana de la ciruga con UN SORBO DE AGUA: no  Traiga la mascarilla CPAP y los tubos el da de la [place].                              No debe trae ningn metal en el cuerpo, incluyendo pinzas para el cabello, joyas, ni aretes/pendientes             No use maquillaje, lociones/cremas, polvos, perfumes/colonias o desodorante  No se rasure en las 48 horas antes de la operacin.               Los hombres pueden [employer]  cara y el cuello.   No traiga objetos de valor al hospital. [place] NO SE HACE RESPONSABLE DE LOS OBJETOS DE VALOR.   Los contactos, las dentaduras o los puentes no se pueden usar durante la [place].   Sheela Stack una bolsa pequea para la noche el da de la [place].   NO TRAIGA AL HOSPITAL LOS MEDICAMENTOS QUE TOMA EN CASA . [place] EN SU LISTA [place]!    Los pacientes dados de alta el mismo da de la ciruga no podrn Air cabin crew a casa.  Es NECESARIO que alguien se quede con usted durante las primeras 24 horas despus de la anestesia.   Instrucciones especiales: Ane Payment copia de sus documentos de poder notarial y testamento vital el da de su ciruga si no los ha escaneado antes.              Por favor, lea las siguientes hojas informativas que le dieron: [place] [employer] [phone] Schneiders    If you received a COVID test during your pre-op visit  it is requested that you wear a mask when out in public, stay away from anyone that may not be feeling well and notify your surgeon if you develop symptoms. If you test positive for Covid or have been in contact with anyone that has tested positive in the last 10 days please notify you surgeon.    [place] - Preparing for Surgery Before surgery, you can play an important role.  Because skin is not sterile, your skin needs to be as free of germs as possible.  You can reduce the number of germs on your skin by washing with CHG (chlorahexidine gluconate) soap before surgery.  CHG is an antiseptic cleaner which kills germs and bonds with the skin to continue killing germs even after washing. Please DO NOT use if you have an allergy to CHG or antibacterial soaps.  If your skin becomes reddened/irritated stop using the CHG and inform your nurse when you arrive at Short Stay. Do not shave (including legs and underarms) for at least 48 hours prior to the first CHG shower.  You may shave your face/neck.  Please follow these instructions carefully:  1.  Shower with CHG Soap the night before surgery and the  morning of surgery.  2.  If you choose to wash your hair, wash your hair first as usual with your normal  shampoo.  3.  After you shampoo, rinse your hair and body thoroughly  to remove the shampoo.                             4.  Use CHG as you would any other liquid soap.  You can apply chg directly to the skin and wash.  Gently with a scrungie or clean washcloth.  5.  Apply the CHG Soap to your body ONLY FROM THE NECK DOWN.   Do   not use on face/ open                           Wound or open sores. Avoid contact with eyes, ears mouth and   genitals (private parts).                       Wash face,  Genitals (private parts) with your normal soap.             6.  Wash thoroughly, paying special attention to the area where your    surgery  will be performed.  7.  Thoroughly rinse your body with warm water from the neck down.  8.  DO NOT shower/wash with your normal soap after using and rinsing off the CHG Soap.                9.  Pat yourself dry with a clean towel.            10.  Wear clean pajamas.            11.  Place clean sheets on your bed the night of your first shower and do not  sleep with pets. Day of Surgery : Do not apply any lotions/deodorants the morning of surgery.  Please wear clean clothes to the hospital/surgery center.  FAILURE TO FOLLOW THESE INSTRUCTIONS MAY RESULT IN THE CANCELLATION OF YOUR SURGERY  PATIENT SIGNATURE_________________________________  NURSE SIGNATURE__________________________________                          PREPARACIN PARA [place]                                            Preparing for Surgery  Debido a que la piel no est esterilizada, sta necesita estar lo ms libre de grmenes como sea posible.  Usted puede reducir el nmero de grmenes en la piel lavndose con el jabn de CHG (Chlorahexidine gluconate) antes de la ciruga.  El CHG es un jabn antisptico el cual mata los grmenes y se une a la piel para continuar matando los grmenes incluso hasta despus de lavarse. POR FAVOR NO LO USE SI USTED TIENE ALERGIAS AL CHG.  SI LA PIEL SE IRRITA, DEJE DE USAR EL CHG.  NO SE RASURE DURANTE AL MENOS 12 HORAS ANTES DE LA  PRIMERA DUCHA CON EL CHG. Siga estas instrucciones cuidadosamente:  Dchese la noche anterior a la [place] y de nuevo en la maana de la [place]. Si decide lavarse el cabello, lvelo con su champ normal primero. Enjuague el cabello y el cuerpo para quitarse el [place]. Use el CHG como lo hara con cualquier otro jabn lquido, usando una toallita o esponja vegetal o exfoliante. Aplique el CHG al cuerpo solamente DEL CUELLO PARA ABAJO.  No lo use cerca de los ojos o los genitales. No se lave con su jabn normal despus de usar el CHG. Squese con una toalla limpia. Espere hasta la maana siguiente para aplicarse desodorantes, lociones, excepto en el da de la [place], NO SE APLIQUE LOCIONES. Use pijamas limpias o una bata. Coloque sbanas limpias en su cama la noche de su primera ducha - no duerma con mascotas. 10.  Use ropa limpia al venir al hospital.        ________________________________________________________________________  Adam [place]  An incentive spirometer is a tool that can help keep your lungs clear and active. This tool measures how well you are filling your lungs with each breath. Taking long deep breaths may help reverse or decrease the chance of developing breathing (pulmonary) problems (especially infection) following: A long period of time when you are unable to move or be active. BEFORE THE PROCEDURE  If  the spirometer includes an indicator to show your best effort, your nurse or respiratory therapist will set it to a desired goal. If possible, sit up straight or lean slightly forward. Try not to slouch. Hold the incentive spirometer in an upright position. INSTRUCTIONS FOR USE  Sit on the edge of your bed if possible, or sit up as far as you can in bed or on a chair. Hold the incentive spirometer in an upright position. Breathe out normally. Place the mouthpiece in your mouth and seal your lips tightly around it. Breathe in slowly and as deeply as possible,  raising the piston or the ball toward the top of the column. Hold your breath for 3-5 seconds or for as long as possible. Allow the piston or ball to fall to the bottom of the column. Remove the mouthpiece from your mouth and breathe out normally. Rest for a few seconds and repeat Steps 1 through 7 at least 10 times every 1-2 hours when you are awake. Take your time and take a few normal breaths between deep breaths. The spirometer may include an indicator to show your best effort. Use the indicator as a goal to work toward during each repetition. After each set of 10 deep breaths, practice coughing to be sure your lungs are clear. If you have an incision (the cut made at the time of surgery), support your incision when coughing by placing a pillow or rolled up towels firmly against it. Once you are able to get out of bed, walk around indoors and cough well. You may stop using the incentive spirometer when instructed by your caregiver.  RISKS AND COMPLICATIONS Take your time so you do not get dizzy or light-headed. If you are in pain, you may need to take or ask for pain medication before doing incentive spirometry. It is harder to take a deep breath if you are having pain. AFTER USE Rest and breathe slowly and easily. It can be helpful to keep track of a log of your progress. Your caregiver can provide you with a simple table to help with this. If you are using the spirometer at home, follow these instructions: [place] IF:  You are having difficultly using the spirometer. You have trouble using the spirometer as often as instructed. Your pain medication is not giving enough relief while using the spirometer. You develop fever of 100.5 F (38.1 C) or higher. SEEK IMMEDIATE MEDICAL CARE IF:  You cough up bloody sputum that had not been present before. You develop fever of 102 F (38.9 C) or greater. You develop worsening pain at or near the incision site. MAKE SURE YOU:  Understand  these instructions. Will watch your condition. Will get help right away if you are not doing well or get worse. Document Released: 12/06/2006 Document Revised: 10/18/2011 Document Reviewed: 02/06/2007 [facility] Patient Information 2014 [place], [place].    Espirmetro de incentivo Database administrator video en casa: [email or web address])  Un espirmetro de incentivo es una herramienta que puede ayudarle a mantener los pulmones limpios y [place]. Esta herramienta mide la capacidad en la que se llenan los pulmones con cada respiracin. Tomar respiraciones largas y profundas puede ayudar a revertir o disminuir la probabilidad de Engineering geologist respiratorios (pulmonares) (especialmente infecciones) a consecuencia de: Un largo perodo de [employer] no puede moverse o estar activo(a). ANTES DEL PROCEDIMIENTO  Si el espirmetro incluye un indicador para Engineer, civil (consulting), su enfermera o el(la) terapeuta respiratorio(a) lo [place] a Ardelia Mems  meta deseada. Si es posible, sintese derecho(a) o ligeramente inclinado(a) hacia adelante. Trate de no encorvarse. Sujete el espirmetro de incentive en posicin erguida. INSTRUCCIONES DE USO  Sintese en el borde de la cama si es posible, o sintese lo mejor que pueda en la cama o en una silla. Sujete el espirmetro de incentivo en posicin recta. Exhale el aire normalmente. Colquese la boquilla en la boca y cierre bien los labios alrededor de ella. Inspire de forma lenta y lo ms profundamente posible, elevando el pistn o la [employer] parte superior del cilindro. [place] le respiracin durante 3-5 segundos o tanto tiempo como sea posible. Deje que el pistn o la bolita caigan hasta la parte inferior del cilindro. Retire la boquilla de la boca y exhale normalmente. Descanse unos pocos segundos y repita los pasos del 1 al 7, al menos 10 veces cada 1-2 horas cuando est despierto(a). Tmese su tiempo y realice algunas  respiraciones normales entre las respiraciones profundas. El espirmetro puede incluir un indicador para mostrar su mejor esfuerzo. Use el indicador como una meta a alcanzar por  cada respiracin. Despus de cada serie de 10 respiraciones, practique la tos para asegurarse de que sus pulmones estn limpios. Si tiene una incisin (el corte realizado al momento de la ciruga), sujete la incisin al toser colocando una almohada o una toalla enrollada firmemente contra ella. Cuando ya pueda levantarse de la cama, camine dentro de la casa y [place]. Puede dejar de utilizar el espirmetro de incentivo cuando se lo indique el(la) que est a cargo de su cuidado.  RIESGOS Y COMPLICACIONES Tmese su tiempo para no marearse ni sentirse aturdido(a). Si tiene Social research officer, government, es posible que necesite tomar o pedir medicamento para Conservation officer, historic buildings antes de usar el espirmetro de incentivo. Es ms difcil respirar profundamente si tiene dolor. DESPUS DEL USO Descanse y respire lenta y fcilmente. Puede ser til llevar un registro de su progreso. El(la) que est a cargo de su cuidado puede darle una tabla sencilla para ayudarle con esto. Si est utilizando Teacher, music, siga estas instrucciones: BUSQUE ATENCIN MDICA SI:  Est teniendo dificultad para Therapist, music. Tiene problemas para usar el espirmetro con la frecuencia que se le indic. Su medicamento para Conservation officer, historic buildings no le est aliviando lo suficiente cuando est usando el espirmetro. Tiene fiebre de 100.5 F (38.1 C) o ms. BUSQUE ATENCIN MDICA INMEDIATA SI:  Al toser expulse un esputo con sangre que no tena antes. Tiene fiebre de 102 F (38.9 C) o ms. Le empeora el dolor en el lugar de la incisin o cerca de l. ASEGRESE DE QUE:  Entiende estas instrucciones. Observar su condicin. Buscar ayuda de inmediato si no se siente bien o empeora. Documento publicado: [id number] Documento revisado: 10/18/2011 Documentp revisado: 02/06/2007 ExitCare  Patient Information 2014 Exit[employer].     ________________________________________________________________________ WHAT IS A BLOOD TRANSFUSION? Blood Transfusion Information  A transfusion is the replacement of blood or some of its parts. Blood is made up of multiple cells which provide different functions. Red blood cells carry oxygen and are used for blood loss replacement. White blood cells fight against infection. Platelets control bleeding. Plasma helps clot blood. Other blood products are available for specialized needs, such as hemophilia or other clotting disorders. BEFORE THE TRANSFUSION  Who gives blood for transfusions?  Healthy volunteers who are fully evaluated to make sure their blood is safe. This is blood bank blood. Transfusion therapy is the safest it has ever been in  the practice of medicine. Before blood is taken from a donor, a complete history is taken to make sure that person has no history of diseases nor engages in risky social behavior (examples are intravenous drug use or sexual activity with multiple partners). The donor's travel history is screened to minimize risk of transmitting infections, such as malaria. The donated blood is tested for signs of infectious diseases, such as HIV and hepatitis. The blood is then tested to be sure it is compatible with you in order to minimize the chance of a transfusion reaction. If you or a relative donates blood, this is often done in anticipation of surgery and is not appropriate for emergency situations. It takes many days to process the donated blood. RISKS AND COMPLICATIONS Although transfusion therapy is very safe and saves many lives, the main dangers of transfusion include:  Getting an infectious disease. Developing a transfusion reaction. This is an allergic reaction to something in the blood you were given. Every precaution is taken to prevent this. The decision to have a blood transfusion has been considered carefully  by your caregiver before blood is given. Blood is not given unless the benefits outweigh the risks. AFTER THE TRANSFUSION Right after receiving a blood transfusion, you will usually feel much better and more energetic. This is especially true if your red blood cells have gotten low (anemic). The transfusion raises the level of the red blood cells which carry oxygen, and this usually causes an energy increase. The nurse administering the transfusion will monitor you carefully for complications. HOME CARE INSTRUCTIONS  No special instructions are needed after a transfusion. You may find your energy is better. Speak with your caregiver about any limitations on activity for underlying diseases you may have. SEEK MEDICAL CARE IF:  Your condition is not improving after your transfusion. You develop redness or irritation at the intravenous (IV) site. SEEK IMMEDIATE MEDICAL CARE IF:  Any of the following symptoms occur over the next 12 hours: Shaking chills. You have a temperature by mouth above 102 F (38.9 C), not controlled by medicine. Chest, back, or muscle pain. People around you feel you are not acting correctly or are confused. Shortness of breath or difficulty breathing. Dizziness and fainting. You get a rash or develop hives. You have a decrease in urine output. Your urine turns a dark color or changes to pink, red, or brown. Any of the following symptoms occur over the next 10 days: You have a temperature by mouth above 102 F (38.9 C), not controlled by medicine. Shortness of breath. Weakness after normal activity. The white part of the eye turns yellow (jaundice). You have a decrease in the amount of urine or are urinating less often. Your urine turns a dark color or changes to pink, red, or brown. Document Released: 07/23/2000 Document Revised: 10/18/2011 Document Reviewed: 03/11/2008 ExitCare Patient Information 2014 ExitCare,  [place].  _______________________________________________________________________  [place]? Informacin sobre la transfusin de [place] transfusin es la sustitucin de [place] o de algunas de sus partes. La sangre est formada por mltiples clulas las cuales desempean diferentes funciones.   Los glbulos rojos transportan oxgeno y se [place] para Government social research officer perdida.   [place].   Las plaquetas controlan el sangrado.   El plasma ayuda a Psychologist, sport and exercise.   Hay otros productos sanguneos disponibles para necesidades especiales, como la hemofilia u otros trastornos de Air cabin crew.  [place]  Quin dona sangre para transfusiones?   Voluntarios saludables que son evaluados a fondo para asegurarse de que su sangre es segura. Esta sangre procede de bancos de sangre.  La terapia de transfusin es la ms segura que ha existido en la prctica de Careers adviser. Antes de extraer sangre de un donante, se hace un historial completo para asegurarse de que esa persona no tiene antecedentes de enfermedades ni participa en conductas sociales de riesgo (entre los ejemplos est el uso de drogas intravenosas o actividad sexual con mltiples parejas). Se investiga el historial de viajes del donante para minimizar los riesgos de transmisin de infecciones, como la malaria/paludismo. La sangre donada se analiza para Hydrographic surveyor signos de enfermedades infecciosas, como el VIH y la hepatitis. Despus se analiza la sangre para asegurarse de que es compatible con usted, con el fin de minimizar la posibilidad de una reaccin a la transfusin. Si usted o un familiar suyo [employer], con frecuencia lo hacen antes de Clementeen Hoof y no es apropiado para situaciones de Freight forwarder. El proceso de la sangre donada dura muchos das.  RIESGOS Y COMPLICACIONES  Aunque la terapia de transfusiones es muy segura y salva muchas vidas,  los principales peligros de la transfusin son:   Rollene Rotunda enfermedad infecciosa.   Desarrollar una reaccin a la transfusin. Esto es una reaccin alrgica a algo en la sangre que se le ha dado. Se toman todas las precauciones para evitar esto.  La decisin de recibir una transfusin de sangre ha sido considerada cuidadosamente por su cuidador antes de que se le d [facility]bal[facility]. La sangre no se da a menos que los beneficios [employer].  DESPUS DE LA TRANSFUSIN   Inmediatamente despus de recibir una transfusin de [place], normalmente se sentir mucho mejor y con ms energa. Esto es especialmente cierto si sus glbulos rojos estn bajos (anmico). La transfusin eleva el nivel de los glbulos rojos que transportan el oxgeno, y esto produce un aumento de Teacher, early years/pre.   La enfermera que le administre la transfusin le observar cuidadosamente para evitar complicaciones.  INSTRUCCIONES PARA LOS CUIDADOS EN CASA  No se necesitan instrucciones especiales despus de una transfusin. Puede notar que su energa mejora. Hable con su cuidador(a) sobre cualquier limitacin en la actividad en caso de enfermedades no diagnosticadas que pueda tener.  BUSQUE ATENCIN MDICA SI:   Su condicin no mejora despus de la transfusin.   Presenta enrojecimiento o irritacin en el lugar de la va intravenosa (IV).  BUSQUE ATENCIN MDICA INMEDIATA SI:  Se presentan alguno de los sntomas a continuacin en las siguientes 12 horas:   Escalofros.   Tiene una temperatura por va oral superior a 102 F (38.9 C), que no se controla con medicamentos.   Dolor de [place], de espalda o muscular.   Las personas a su alrededor sienten que usted no est actuando correctamente o est confuso(a).   Corinne para respirar.   Mareos y [employer].   Erupcin cutnea o le salen ronchas.   Disminucin en la produccin de [place].   Su orina se vuelve de color oscuro o cambia a  color rosado, rojo o marrn.  Se presentan alguno de los sntomas a continuacin en los 10 das siguientes:   Tiene una temperatura por va oral superior a 102 F (38.9 C), que no se controla con medicamentos.   Dificultad para respirar.   Debilidad despus de una actividad normal.   La parte blanca del ojo se vuelve  amarilla (ictericia).   Disminucin en la produccin de [place] u orina con menos frecuencia.   Su orina se vuelve de color oscuro o cambia a color rosado, rojo o marrn.  Documento publicado: [id number] Documento revisado: 10/18/2011 Documento revisado: 03/11/2008  ExitCare Patient Information 2014 [place], [place].

## 2022-10-23 LAB — HEMOGLOBIN A1C
Hgb A1c MFr Bld: 6.1 % — ABNORMAL HIGH (ref 4.8–5.6)
Mean Plasma Glucose: 128 mg/dL

## 2022-10-25 ENCOUNTER — Inpatient Hospital Stay (HOSPITAL_COMMUNITY): Payer: BC Managed Care – PPO | Admitting: Certified Registered Nurse Anesthetist

## 2022-10-25 ENCOUNTER — Inpatient Hospital Stay (HOSPITAL_COMMUNITY)
Admission: RE | Admit: 2022-10-25 | Discharge: 2022-10-27 | DRG: 621 | Disposition: A | Discharge status: Home / Self Care | Payer: BC Managed Care – PPO | Attending: General Surgery | Admitting: General Surgery

## 2022-10-25 ENCOUNTER — Encounter (HOSPITAL_COMMUNITY)
Admission: RE | Disposition: A | Discharge status: Home / Self Care | Payer: Self-pay | Source: Home / Self Care | Attending: General Surgery

## 2022-10-25 ENCOUNTER — Encounter (HOSPITAL_COMMUNITY): Payer: Self-pay | Admitting: General Surgery

## 2022-10-25 ENCOUNTER — Other Ambulatory Visit: Payer: Self-pay

## 2022-10-25 DIAGNOSIS — K8020 Calculus of gallbladder without cholecystitis without obstruction: Secondary | ICD-10-CM | POA: Diagnosis present

## 2022-10-25 DIAGNOSIS — E611 Iron deficiency: Secondary | ICD-10-CM

## 2022-10-25 DIAGNOSIS — Z833 Family history of diabetes mellitus: Secondary | ICD-10-CM

## 2022-10-25 DIAGNOSIS — Z9884 Bariatric surgery status: Principal | ICD-10-CM

## 2022-10-25 DIAGNOSIS — G4733 Obstructive sleep apnea (adult) (pediatric): Secondary | ICD-10-CM | POA: Diagnosis present

## 2022-10-25 DIAGNOSIS — Z87891 Personal history of nicotine dependence: Secondary | ICD-10-CM | POA: Diagnosis not present

## 2022-10-25 DIAGNOSIS — E559 Vitamin D deficiency, unspecified: Secondary | ICD-10-CM | POA: Diagnosis present

## 2022-10-25 DIAGNOSIS — Z6841 Body Mass Index (BMI) 40.0 and over, adult: Secondary | ICD-10-CM

## 2022-10-25 DIAGNOSIS — E785 Hyperlipidemia, unspecified: Secondary | ICD-10-CM | POA: Diagnosis present

## 2022-10-25 DIAGNOSIS — R7303 Prediabetes: Secondary | ICD-10-CM | POA: Diagnosis present

## 2022-10-25 HISTORY — PX: GASTRIC ROUX-EN-Y: SHX5262

## 2022-10-25 LAB — TYPE AND SCREEN
ABO/RH(D): O POS
Antibody Screen: NEGATIVE

## 2022-10-25 LAB — HEMOGLOBIN AND HEMATOCRIT, BLOOD
HCT: 46.6 % (ref 39.0–52.0)
Hemoglobin: 15.1 g/dL (ref 13.0–17.0)

## 2022-10-25 LAB — CBC
HCT: 45.9 % (ref 39.0–52.0)
Hemoglobin: 15.3 g/dL (ref 13.0–17.0)
MCH: 29.8 pg (ref 26.0–34.0)
MCHC: 33.3 g/dL (ref 30.0–36.0)
MCV: 89.3 fL (ref 80.0–100.0)
Platelets: 327 10*3/uL (ref 150–400)
RBC: 5.14 MIL/uL (ref 4.22–5.81)
RDW: 14.3 % (ref 11.5–15.5)
WBC: 17 10*3/uL — ABNORMAL HIGH (ref 4.0–10.5)
nRBC: 0 % (ref 0.0–0.2)

## 2022-10-25 LAB — ABO/RH: ABO/RH(D): O POS

## 2022-10-25 SURGERY — LAPAROSCOPIC ROUX-EN-Y GASTRIC BYPASS WITH UPPER ENDOSCOPY
Anesthesia: General

## 2022-10-25 MED ORDER — LIDOCAINE HCL (PF) 2 % IJ SOLN
INTRAMUSCULAR | Status: AC
  Filled 2022-10-25: qty 5

## 2022-10-25 MED ORDER — ACETAMINOPHEN 500 MG PO TABS
1000.0000 mg | ORAL_TABLET | Freq: Three times a day (TID) | ORAL | Status: DC
  Administered 2022-10-25 – 2022-10-27 (×6): 1000 mg via ORAL
  Filled 2022-10-25 (×6): qty 2

## 2022-10-25 MED ORDER — ONDANSETRON HCL 4 MG/2ML IJ SOLN: 4.0000 mg | Freq: Four times a day (QID) | INTRAMUSCULAR | Status: DC | PRN

## 2022-10-25 MED ORDER — MIDAZOLAM HCL 2 MG/2ML IJ SOLN
INTRAMUSCULAR | Status: AC
  Filled 2022-10-25: qty 2

## 2022-10-25 MED ORDER — OXYCODONE HCL 5 MG/5ML PO SOLN
5.0000 mg | Freq: Four times a day (QID) | ORAL | Status: DC | PRN
  Administered 2022-10-25 – 2022-10-26 (×6): 5 mg via ORAL
  Filled 2022-10-25 (×6): qty 5

## 2022-10-25 MED ORDER — LACTATED RINGERS IV SOLN: INTRAVENOUS | Status: DC

## 2022-10-25 MED ORDER — DEXAMETHASONE SODIUM PHOSPHATE 10 MG/ML IJ SOLN
INTRAMUSCULAR | Status: AC
  Filled 2022-10-25: qty 1

## 2022-10-25 MED ORDER — DEXAMETHASONE SODIUM PHOSPHATE 10 MG/ML IJ SOLN
INTRAMUSCULAR | Status: DC | PRN
  Administered 2022-10-25: 10 mg via INTRAVENOUS

## 2022-10-25 MED ORDER — PHENYLEPHRINE 80 MCG/ML (10ML) SYRINGE FOR IV PUSH (FOR BLOOD PRESSURE SUPPORT)
PREFILLED_SYRINGE | INTRAVENOUS | Status: DC | PRN
  Administered 2022-10-25: 160 ug via INTRAVENOUS

## 2022-10-25 MED ORDER — BUPIVACAINE-EPINEPHRINE (PF) 0.25% -1:200000 IJ SOLN
INTRAMUSCULAR | Status: AC
  Filled 2022-10-25: qty 30

## 2022-10-25 MED ORDER — LACTATED RINGERS IR SOLN
Status: DC | PRN
  Administered 2022-10-25: 1000 mL

## 2022-10-25 MED ORDER — PROMETHAZINE HCL 25 MG/ML IJ SOLN: 6.2500 mg | INTRAMUSCULAR | Status: DC | PRN

## 2022-10-25 MED ORDER — AMISULPRIDE (ANTIEMETIC) 5 MG/2ML IV SOLN: 10.0000 mg | Freq: Once | INTRAVENOUS | Status: AC

## 2022-10-25 MED ORDER — CHLORHEXIDINE GLUCONATE 4 % EX LIQD: Freq: Once | CUTANEOUS | Status: DC

## 2022-10-25 MED ORDER — PROPOFOL 10 MG/ML IV BOLUS
INTRAVENOUS | Status: DC | PRN
  Administered 2022-10-25: 200 mg via INTRAVENOUS

## 2022-10-25 MED ORDER — KETAMINE HCL 50 MG/5ML IJ SOSY
PREFILLED_SYRINGE | INTRAMUSCULAR | Status: AC
  Filled 2022-10-25: qty 5

## 2022-10-25 MED ORDER — ACETAMINOPHEN 500 MG PO TABS
1000.0000 mg | ORAL_TABLET | ORAL | Status: AC
  Administered 2022-10-25: 1000 mg via ORAL
  Filled 2022-10-25: qty 2

## 2022-10-25 MED ORDER — SUGAMMADEX SODIUM 500 MG/5ML IV SOLN
INTRAVENOUS | Status: AC
  Filled 2022-10-25: qty 5

## 2022-10-25 MED ORDER — ONDANSETRON HCL 4 MG/2ML IJ SOLN
INTRAMUSCULAR | Status: DC | PRN
  Administered 2022-10-25: 4 mg via INTRAVENOUS

## 2022-10-25 MED ORDER — LIDOCAINE 2% (20 MG/ML) 5 ML SYRINGE
INTRAMUSCULAR | Status: DC | PRN
  Administered 2022-10-25: 1.5 mg/kg/h via INTRAVENOUS
  Administered 2022-10-25: 40 mg via INTRAVENOUS

## 2022-10-25 MED ORDER — HEPARIN SODIUM (PORCINE) 5000 UNIT/ML IJ SOLN
5000.0000 [IU] | Freq: Three times a day (TID) | INTRAMUSCULAR | Status: DC
  Administered 2022-10-25 – 2022-10-27 (×5): 5000 [IU] via SUBCUTANEOUS
  Filled 2022-10-25 (×5): qty 1

## 2022-10-25 MED ORDER — BUPIVACAINE LIPOSOME 1.3 % IJ SUSP
INTRAMUSCULAR | Status: DC | PRN
  Administered 2022-10-25: 20 mL

## 2022-10-25 MED ORDER — MEPERIDINE HCL 50 MG/ML IJ SOLN: 6.2500 mg | INTRAMUSCULAR | Status: DC | PRN

## 2022-10-25 MED ORDER — BUPIVACAINE LIPOSOME 1.3 % IJ SUSP
INTRAMUSCULAR | Status: AC
  Filled 2022-10-25: qty 20

## 2022-10-25 MED ORDER — ESMOLOL HCL 100 MG/10ML IV SOLN
INTRAVENOUS | Status: AC
  Filled 2022-10-25: qty 10

## 2022-10-25 MED ORDER — AMISULPRIDE (ANTIEMETIC) 5 MG/2ML IV SOLN
INTRAVENOUS | Status: AC
  Administered 2022-10-25: 10 mg via INTRAVENOUS
  Filled 2022-10-25: qty 4

## 2022-10-25 MED ORDER — MIDAZOLAM HCL 5 MG/5ML IJ SOLN
INTRAMUSCULAR | Status: DC | PRN
  Administered 2022-10-25: 2 mg via INTRAVENOUS

## 2022-10-25 MED ORDER — HEPARIN SODIUM (PORCINE) 5000 UNIT/ML IJ SOLN
5000.0000 [IU] | INTRAMUSCULAR | Status: AC
  Administered 2022-10-25: 5000 [IU] via SUBCUTANEOUS
  Filled 2022-10-25: qty 1

## 2022-10-25 MED ORDER — DEXAMETHASONE SODIUM PHOSPHATE 4 MG/ML IJ SOLN: 4.0000 mg | INTRAMUSCULAR | Status: DC

## 2022-10-25 MED ORDER — ORAL CARE MOUTH RINSE: 15.0000 mL | Freq: Once | OROMUCOSAL | Status: AC

## 2022-10-25 MED ORDER — KETAMINE HCL 10 MG/ML IJ SOLN
INTRAMUSCULAR | Status: DC | PRN
  Administered 2022-10-25: 30 mg via INTRAVENOUS
  Administered 2022-10-25: 20 mg via INTRAVENOUS

## 2022-10-25 MED ORDER — PHENYLEPHRINE 80 MCG/ML (10ML) SYRINGE FOR IV PUSH (FOR BLOOD PRESSURE SUPPORT)
PREFILLED_SYRINGE | INTRAVENOUS | Status: AC
  Filled 2022-10-25: qty 10

## 2022-10-25 MED ORDER — PROPOFOL 10 MG/ML IV BOLUS
INTRAVENOUS | Status: AC
  Filled 2022-10-25: qty 20

## 2022-10-25 MED ORDER — SIMETHICONE 80 MG PO CHEW
80.0000 mg | CHEWABLE_TABLET | Freq: Four times a day (QID) | ORAL | Status: DC | PRN
  Administered 2022-10-25 – 2022-10-26 (×2): 80 mg via ORAL
  Filled 2022-10-25 (×2): qty 1

## 2022-10-25 MED ORDER — ENSURE MAX PROTEIN PO LIQD
2.0000 [oz_av] | ORAL | Status: DC
  Administered 2022-10-26 – 2022-10-27 (×9): 2 [oz_av] via ORAL

## 2022-10-25 MED ORDER — LABETALOL HCL 5 MG/ML IV SOLN: 5.0000 mg | INTRAVENOUS | Status: DC | PRN

## 2022-10-25 MED ORDER — HYDRALAZINE HCL 20 MG/ML IJ SOLN
10.0000 mg | INTRAMUSCULAR | Status: DC | PRN
  Administered 2022-10-25: 10 mg via INTRAVENOUS
  Filled 2022-10-25: qty 1

## 2022-10-25 MED ORDER — BUPIVACAINE-EPINEPHRINE 0.25% -1:200000 IJ SOLN
INTRAMUSCULAR | Status: DC | PRN
  Administered 2022-10-25: 30 mL

## 2022-10-25 MED ORDER — ESMOLOL HCL 100 MG/10ML IV SOLN
INTRAVENOUS | Status: DC | PRN
  Administered 2022-10-25: 20 mg via INTRAVENOUS

## 2022-10-25 MED ORDER — SODIUM CHLORIDE 0.9 % IV SOLN: 12.5000 mg | Freq: Three times a day (TID) | INTRAVENOUS | Status: DC | PRN

## 2022-10-25 MED ORDER — ROCURONIUM BROMIDE 10 MG/ML (PF) SYRINGE
PREFILLED_SYRINGE | INTRAVENOUS | Status: AC
  Filled 2022-10-25: qty 10

## 2022-10-25 MED ORDER — KCL IN DEXTROSE-NACL 20-5-0.45 MEQ/L-%-% IV SOLN
INTRAVENOUS | Status: DC
  Filled 2022-10-25 (×6): qty 1000

## 2022-10-25 MED ORDER — LIDOCAINE HCL (PF) 2 % IJ SOLN
INTRAMUSCULAR | Status: AC
  Filled 2022-10-25: qty 10

## 2022-10-25 MED ORDER — SCOPOLAMINE 1 MG/3DAYS TD PT72
1.0000 | MEDICATED_PATCH | TRANSDERMAL | Status: DC
  Administered 2022-10-25: 1.5 mg via TRANSDERMAL
  Filled 2022-10-25: qty 1

## 2022-10-25 MED ORDER — "VISTASEAL 4 ML SINGLE DOSE KIT "
4.0000 mL | PACK | Freq: Once | CUTANEOUS | Status: DC
  Filled 2022-10-25: qty 4

## 2022-10-25 MED ORDER — ACETAMINOPHEN 160 MG/5ML PO SOLN: 1000.0000 mg | Freq: Three times a day (TID) | ORAL | Status: DC

## 2022-10-25 MED ORDER — MIDAZOLAM HCL 2 MG/2ML IJ SOLN: 0.5000 mg | Freq: Once | INTRAMUSCULAR | Status: DC | PRN

## 2022-10-25 MED ORDER — CHLORHEXIDINE GLUCONATE 0.12 % MT SOLN
15.0000 mL | Freq: Once | OROMUCOSAL | Status: AC
  Administered 2022-10-25: 15 mL via OROMUCOSAL

## 2022-10-25 MED ORDER — PANTOPRAZOLE SODIUM 40 MG IV SOLR
40.0000 mg | Freq: Every day | INTRAVENOUS | Status: DC
  Administered 2022-10-25 – 2022-10-26 (×2): 40 mg via INTRAVENOUS
  Filled 2022-10-25 (×2): qty 10

## 2022-10-25 MED ORDER — PHENYLEPHRINE HCL-NACL 20-0.9 MG/250ML-% IV SOLN
INTRAVENOUS | Status: DC | PRN
  Administered 2022-10-25: 20 ug/min via INTRAVENOUS

## 2022-10-25 MED ORDER — MORPHINE SULFATE (PF) 2 MG/ML IV SOLN: 1.0000 mg | INTRAVENOUS | Status: DC | PRN

## 2022-10-25 MED ORDER — OXYCODONE HCL 5 MG PO TABS: 5.0000 mg | ORAL_TABLET | Freq: Once | ORAL | Status: DC | PRN

## 2022-10-25 MED ORDER — APREPITANT 40 MG PO CAPS
40.0000 mg | ORAL_CAPSULE | ORAL | Status: AC
  Administered 2022-10-25: 40 mg via ORAL
  Filled 2022-10-25: qty 1

## 2022-10-25 MED ORDER — ONDANSETRON HCL 4 MG/2ML IJ SOLN
INTRAMUSCULAR | Status: AC
  Filled 2022-10-25: qty 2

## 2022-10-25 MED ORDER — BUPIVACAINE LIPOSOME 1.3 % IJ SUSP: 20.0000 mL | Freq: Once | INTRAMUSCULAR | Status: DC

## 2022-10-25 MED ORDER — SODIUM CHLORIDE 0.9 % IV SOLN
2.0000 g | INTRAVENOUS | Status: AC
  Administered 2022-10-25: 2 g via INTRAVENOUS
  Filled 2022-10-25: qty 2

## 2022-10-25 MED ORDER — FIBRIN SEALANT 2 ML SINGLE DOSE KIT
2.0000 mL | PACK | Freq: Once | CUTANEOUS | Status: AC
  Administered 2022-10-25: 2 mL via TOPICAL
  Filled 2022-10-25: qty 2

## 2022-10-25 MED ORDER — OXYCODONE HCL 5 MG/5ML PO SOLN: 5.0000 mg | Freq: Once | ORAL | Status: DC | PRN

## 2022-10-25 MED ORDER — HYDROMORPHONE HCL 1 MG/ML IJ SOLN
INTRAMUSCULAR | Status: AC
  Administered 2022-10-25: 0.5 mg via INTRAVENOUS
  Filled 2022-10-25: qty 1

## 2022-10-25 MED ORDER — SUGAMMADEX SODIUM 200 MG/2ML IV SOLN
INTRAVENOUS | Status: DC | PRN
  Administered 2022-10-25: 400 mg via INTRAVENOUS

## 2022-10-25 MED ORDER — ROCURONIUM BROMIDE 10 MG/ML (PF) SYRINGE
PREFILLED_SYRINGE | INTRAVENOUS | Status: DC | PRN
  Administered 2022-10-25: 70 mg via INTRAVENOUS
  Administered 2022-10-25 (×2): 30 mg via INTRAVENOUS
  Administered 2022-10-25: 20 mg via INTRAVENOUS

## 2022-10-25 MED ORDER — FENTANYL CITRATE (PF) 250 MCG/5ML IJ SOLN
INTRAMUSCULAR | Status: AC
  Filled 2022-10-25: qty 5

## 2022-10-25 MED ORDER — FENTANYL CITRATE (PF) 250 MCG/5ML IJ SOLN
INTRAMUSCULAR | Status: DC | PRN
  Administered 2022-10-25: 150 ug via INTRAVENOUS
  Administered 2022-10-25 (×2): 50 ug via INTRAVENOUS

## 2022-10-25 MED ORDER — HYDROMORPHONE HCL 1 MG/ML IJ SOLN
0.2500 mg | INTRAMUSCULAR | Status: DC | PRN
  Administered 2022-10-25: 0.5 mg via INTRAVENOUS

## 2022-10-25 SURGICAL SUPPLY — 82 items
ANTIFOG SOL W/FOAM PAD STRL (MISCELLANEOUS) ×1
APL LAPSCP 35 DL APL RGD (MISCELLANEOUS) ×2
APL PRP STRL LF DISP 70% ISPRP (MISCELLANEOUS) ×2
APL SWBSTK 6 STRL LF DISP (MISCELLANEOUS)
APPLICATOR COTTON TIP 6 STRL (MISCELLANEOUS) IMPLANT
APPLICATOR COTTON TIP 6IN STRL (MISCELLANEOUS)
APPLICATOR VISTASEAL 35 (MISCELLANEOUS) ×2 IMPLANT
APPLIER CLIP ROT 13.4 12 LRG (CLIP)
APR CLP LRG 13.4X12 ROT 20 MLT (CLIP)
BAG COUNTER SPONGE SURGICOUNT (BAG) IMPLANT
BAG SPNG CNTER NS LX DISP (BAG)
BLADE SURG SZ11 CARB STEEL (BLADE) ×1 IMPLANT
CABLE HIGH FREQUENCY MONO STRZ (ELECTRODE) IMPLANT
CHLORAPREP W/TINT 26 (MISCELLANEOUS) ×2 IMPLANT
CLIP APPLIE ROT 13.4 12 LRG (CLIP) IMPLANT
CLIP SUT LAPRA TY ABSORB (SUTURE) ×2 IMPLANT
CUTTER FLEX LINEAR 45M (STAPLE) IMPLANT
DEVICE SUT QUICK LOAD TK 5 (SUTURE) IMPLANT
DEVICE SUT TI-KNOT TK 5X26 (SUTURE) IMPLANT
DEVICE SUTURE ENDOST 10MM (ENDOMECHANICALS) ×1 IMPLANT
DRAIN PENROSE 0.25X18 (DRAIN) ×1 IMPLANT
DRSG TEGADERM 2-3/8X2-3/4 SM (GAUZE/BANDAGES/DRESSINGS) ×6 IMPLANT
ELECT REM PT RETURN 15FT ADLT (MISCELLANEOUS) ×1 IMPLANT
GAUZE 4X4 16PLY ~~LOC~~+RFID DBL (SPONGE) ×1 IMPLANT
GAUZE SPONGE 2X2 8PLY STRL LF (GAUZE/BANDAGES/DRESSINGS) ×1 IMPLANT
GAUZE SPONGE 4X4 12PLY STRL (GAUZE/BANDAGES/DRESSINGS) IMPLANT
GLOVE BIO SURGEON STRL SZ7.5 (GLOVE) ×1 IMPLANT
GLOVE INDICATOR 8.0 STRL GRN (GLOVE) ×1 IMPLANT
GOWN STRL REUS W/ TWL XL LVL3 (GOWN DISPOSABLE) ×4 IMPLANT
GOWN STRL REUS W/TWL XL LVL3 (GOWN DISPOSABLE) ×4
IRRIG SUCT STRYKERFLOW 2 WTIP (MISCELLANEOUS) ×1
IRRIGATION SUCT STRKRFLW 2 WTP (MISCELLANEOUS) ×1 IMPLANT
KIT BASIN OR (CUSTOM PROCEDURE TRAY) ×1 IMPLANT
KIT GASTRIC LAVAGE 34FR ADT (SET/KITS/TRAYS/PACK) ×1 IMPLANT
KIT TURNOVER KIT A (KITS) IMPLANT
MARKER SKIN DUAL TIP RULER LAB (MISCELLANEOUS) ×1 IMPLANT
MAT PREVALON FULL STRYKER (MISCELLANEOUS) ×1 IMPLANT
NDL SPNL 22GX3.5 QUINCKE BK (NEEDLE) ×1 IMPLANT
NEEDLE SPNL 22GX3.5 QUINCKE BK (NEEDLE) ×1 IMPLANT
PACK CARDIOVASCULAR III (CUSTOM PROCEDURE TRAY) ×1 IMPLANT
RELOAD 45 VASCULAR/THIN (ENDOMECHANICALS) IMPLANT
RELOAD ENDO STITCH 2.0 (ENDOMECHANICALS) ×9
RELOAD STAPLE 45 2.5 WHT GRN (ENDOMECHANICALS) IMPLANT
RELOAD STAPLE 45 3.5 BLU ETS (ENDOMECHANICALS) IMPLANT
RELOAD STAPLE 60 2.6 WHT THN (STAPLE) ×2 IMPLANT
RELOAD STAPLE 60 3.6 BLU REG (STAPLE) ×2 IMPLANT
RELOAD STAPLE 60 3.8 GOLD REG (STAPLE) ×1 IMPLANT
RELOAD STAPLE TA45 3.5 REG BLU (ENDOMECHANICALS) IMPLANT
RELOAD STAPLER BLUE 60MM (STAPLE) ×5 IMPLANT
RELOAD STAPLER GOLD 60MM (STAPLE) ×1 IMPLANT
RELOAD STAPLER WHITE 60MM (STAPLE) ×2 IMPLANT
RELOAD SUT SNGL STCH ABSRB 2-0 (ENDOMECHANICALS) ×5 IMPLANT
RELOAD SUT SNGL STCH BLK 2-0 (ENDOMECHANICALS) ×4 IMPLANT
SCISSORS LAP 5X45 EPIX DISP (ENDOMECHANICALS) ×1 IMPLANT
SET TUBE SMOKE EVAC HIGH FLOW (TUBING) ×1 IMPLANT
SHEARS HARMONIC ACE PLUS 45CM (MISCELLANEOUS) ×1 IMPLANT
SLEEVE ADV FIXATION 12X100MM (TROCAR) ×2 IMPLANT
SLEEVE ADV FIXATION 5X100MM (TROCAR) IMPLANT
SOLUTION ANTFG W/FOAM PAD STRL (MISCELLANEOUS) ×1 IMPLANT
STAPLER ECHELON BIOABSB 60 FLE (MISCELLANEOUS) IMPLANT
STAPLER ECHELON LONG 60 440 (INSTRUMENTS) ×1 IMPLANT
STAPLER RELOAD BLUE 60MM (STAPLE) ×5
STAPLER RELOAD GOLD 60MM (STAPLE) ×1
STAPLER RELOAD WHITE 60MM (STAPLE) ×2
STRIP CLOSURE SKIN 1/2X4 (GAUZE/BANDAGES/DRESSINGS) ×1 IMPLANT
SURGILUBE 2OZ TUBE FLIPTOP (MISCELLANEOUS) ×1 IMPLANT
SUT MNCRL AB 4-0 PS2 18 (SUTURE) ×1 IMPLANT
SUT RELOAD ENDO STITCH 2 48X1 (ENDOMECHANICALS) ×5
SUT RELOAD ENDO STITCH 2.0 (ENDOMECHANICALS) ×4
SUT SURGIDAC NAB ES-9 0 48 120 (SUTURE) IMPLANT
SUT VIC AB 2-0 SH 27 (SUTURE) ×1
SUT VIC AB 2-0 SH 27X BRD (SUTURE) ×1 IMPLANT
SUTURE RELOAD END STTCH 2 48X1 (ENDOMECHANICALS) ×5 IMPLANT
SUTURE RELOAD ENDO STITCH 2.0 (ENDOMECHANICALS) ×4 IMPLANT
SYR 20ML LL LF (SYRINGE) ×2 IMPLANT
TOWEL OR 17X26 10 PK STRL BLUE (TOWEL DISPOSABLE) ×1 IMPLANT
TOWEL OR NON WOVEN STRL DISP B (DISPOSABLE) ×1 IMPLANT
TRAY FOLEY MTR SLVR 16FR STAT (SET/KITS/TRAYS/PACK) IMPLANT
TROCAR ADV FIXATION 12X100MM (TROCAR) ×1 IMPLANT
TROCAR ADV FIXATION 5X100MM (TROCAR) ×1 IMPLANT
TROCAR XCEL NON-BLD 5MMX100MML (ENDOMECHANICALS) ×1 IMPLANT
TUBING CONNECTING 10 (TUBING) ×2 IMPLANT

## 2022-10-25 NOTE — Op Note (Signed)
William Rose [id number] 1966/05/13. 10/25/2022  Preoperative diagnosis:  Severe obesity (BMI 47)  Prediabetes  Low testosterone in male  Low HDL (under 40)  Vitamin D deficiency  Hyperlipidemia, unspecified hyperlipidemia type   Postoperative  diagnosis:  1. Same + fatty liver  Surgical procedure: Laparoscopic Roux-en-Y gastric bypass (ante-colic, ante-gastric); upper endoscopy  Surgeon: Gayland Curry, M.D. FACS  Asst.: Romana Juniper MD FACS  Anesthesia: General plus exparel/marcaine mix  Complications: None   EBL: Minimal   Drains: None   Disposition: PACU in good condition   Indications for procedure: 57 y.o. yo male with morbid obesity who has been unsuccessful at sustained weight loss. The patient's comorbidities are listed above. We discussed the risk and benefits of surgery including but not limited to anesthesia risk, bleeding, infection, blood clot formation, anastomotic leak, anastomotic stricture, ulcer formation, death, respiratory complications, intestinal blockage, internal hernia, gallstone formation, vitamin and nutritional deficiencies, injury to surrounding structures, failure to lose weight and mood changes.   Description of procedure: Patient is brought to the operating room and general anesthesia induced. The patient had received preoperative broad-spectrum IV antibiotics and subcutaneous heparin. The abdomen was widely sterilely prepped with Chloraprep and draped. Patient timeout was performed and correct patient and procedure confirmed. Access was obtained with a 5 mm Optiview trocar in the left upper quadrant and pneumoperitoneum established without difficulty. Under direct vision 12 mm trocars were placed laterally in the right upper quadrant, right upper quadrant midclavicular line, and to the left and above the umbilicus for the camera port. A 5 mm trocar was placed laterally in the left upper quadrant.  Exparel/marcaine mix was infiltrated in  bilateral lateral abdominal walls as a TAP block for postoperative pain relief.  The omentum was brought into the upper abdomen and the transverse mesocolon elevated and the ligament of Treitz clearly identified. A 50 cm biliopancreatic limb was then carefully measured from the ligament of Treitz. The small intestine was divided at this point with a single firing of the white load linear stapler. A Penrose drain was sutured to the end of the Roux-en-Y limb for later identification. A 100 cm Roux-en-Y limb was then carefully measured. At this point a side-to-side anastomosis was created between the Roux limb and the end of the biliopancreatic limb. This was accomplished with a single firing of the 60 mm white load linear stapler. The common enterotomy was closed with a running 2-0 Vicryl begun at either end of the enterotomy and tied centrally. Vistaseal tissue sealant was placed over the anastomosis. The mesenteric defect was then closed with running 2-0 silk. The omentum was then divided with the harmonic scalpel up towards the transverse colon to allow mobility of the Roux limb toward the gastric pouch. The patient was then placed in steep reversed Trendelenburg. Through a 5 mm subxiphoid site the [facility] retractor was placed and the left lobe of the liver elevated with excellent exposure of the upper stomach and hiatus. The angle of Hiss was then mobilized with the harmonic scalpel. A 5 cm gastric pouch was then carefully measured along the lesser curve of the stomach. Dissection was carried along the lesser curve at this point with the Harmonic scalpel working carefully back toward the lesser sac at right angles to the lesser curve. The free lesser sac was then entered. After being sure all tubes were removed from the stomach an initial firing of the gold load 60 mm linear stapler was fired at right angles across the lesser  curve for about 4 cm. The gastric pouch was further mobilized posteriorly and then the  pouch was completed with 4 further firings of the 60 mm blue load linear stapler up through the previously dissected angle of His. It was ensured that the pouch was completely mobilized away from the gastric remnant. This created a nice tubular 5 cm gastric pouch. The Roux limb was then brought up in an antecolic fashion with the candycane facing to the patient's left without undue tension. The gastrojejunostomy was created with an initial posterior row of 2-0 Vicryl between the Roux limb and the staple line of the gastric pouch. Enterotomies were then made in the gastric pouch and the Roux limb with the harmonic scalpel and at approximately 2-2-1/2 cm anastomosis was created with a single firing of the 70mm blue load linear stapler. The staple line was inspected and was intact without bleeding. The common enterotomy was then closed with running 2-0 Vicryl begun at either end and tied centrally. The Ewall tube was then easily passed through the anastomosis and an outer anterior layer of running 2-0 Vicryl was placed. The Ewald tube was removed. With the outlet of the gastrojejunostomy clamped and under saline irrigation the assistant performed upper endoscopy and with the gastric pouch tensely distended with air-there was no evidence of leak on this test. The pouch was desufflated. The Terance Hart defect was closed with running 2-0 silk. The abdomen was inspected for any evidence of bleeding or bowel injury and everything looked fine. The Nathanson retractor was removed under direct vision after coating the anastomosis with Vistaseal tissue sealant. All CO2 was evacuated and trochars removed. Skin incisions were closed with 4-0 monocryl in a subcuticular fashion followed by  steri-strips and bandages. Sponge needle and instrument counts were correct. The patient was taken to the PACU in good condition.    [name]. Redmond Pulling, MD, FACS General, Bariatric, & Minimally Invasive Surgery [facility], [place]

## 2022-10-25 NOTE — Interval H&P Note (Signed)
History and Physical Interval Note:  10/25/2022 7:28 AM  William Rose  has presented today for surgery, with the diagnosis of MORBID OBESITY.  The various methods of treatment have been discussed with the patient and family. After consideration of risks, benefits and other options for treatment, the patient has consented to  Procedure(s): LAPAROSCOPIC ROUX-EN-Y GASTRIC BYPASS WITH UPPER ENDOSCOPY (N/A) as a surgical intervention.  The patient's history has been reviewed, patient examined, no change in status, stable for surgery.  I have reviewed the patient's chart and labs.  Questions were answered to the patient's satisfaction.     Greer Pickerel

## 2022-10-25 NOTE — Progress Notes (Signed)
PHARMACY CONSULT FOR:  Risk Assessment for Post-Discharge VTE Following Bariatric Surgery  Post-Discharge VTE Risk Assessment: This patient's probability of 30-day post-discharge VTE is increased due to the factors marked:  Sleeve gastrectomy   Liver disorder (transplant, cirrhosis, or nonalcoholic steatohepatitis)   Hx of VTE   Hemorrhage requiring transfusion   GI perforation, leak, or obstruction   ====================================================  X  Male    Age >/=60 years    BMI >/=50 kg/m2    CHF    Dyspnea at Rest    Paraplegia  X  Non-gastric-band surgery    Operation Time >/=3 hr    Return to OR     Length of Stay >/= 3 d   Hypercoagulable condition   Significant venous stasis      Predicted probability of 30-day post-discharge VTE: 0.31%  Other patient-specific factors to consider: none  Recommendation for Discharge: No pharmacologic prophylaxis post-discharge  William Rose is a 57 y.o. male who underwent laparoscopic Roux-en-Y gastric bypass 10/25/2022     No Known Allergies  Patient Measurements: Height: 5' 7.72" (172 cm) Weight: (!) 139.3 kg (307 lb) IBW/kg (Calculated) : 67.76 Body mass index is 47.07 kg/m.  No results for input(s): "WBC", "HGB", "HCT", "PLT", "APTT", "CREATININE", "LABCREA", "CREAT24HRUR", "MG", "PHOS", "ALBUMIN", "PROT", "AST", "ALT", "ALKPHOS", "BILITOT", "BILIDIR", "IBILI" in the last 72 hours. Estimated Creatinine Clearance: 138.8 mL/min (by C-G formula based on SCr of 0.82 mg/dL).    Past Medical History:  Diagnosis Date   Headache    Sleep apnea      Facility-Administered Medications Prior to Admission  Medication Dose Route Frequency Provider Last Rate Last Admin   testosterone cypionate (DEPOTESTOSTERONE CYPIONATE) injection 200 mg  200 mg Intramuscular Q14 Days Luetta Nutting, DO   200 mg at 04/02/22 1056   testosterone cypionate (DEPOTESTOSTERONE CYPIONATE) injection 200 mg  200 mg Intramuscular Q14 Days  Silverio Decamp, MD   200 mg at 06/25/22 1052   Medications Prior to Admission  Medication Sig Dispense Refill Last Dose   Semaglutide, 2 MG/DOSE, 8 MG/3ML SOPN Inject 2 mg as directed once a week. (Patient not taking: Reported on 10/22/2022) 9 mL 1      Eudelia Bunch, Pharm.D Use secure chat for questions 10/25/2022 11:54 AM

## 2022-10-25 NOTE — Anesthesia Preprocedure Evaluation (Addendum)
Anesthesia Evaluation  Patient identified by MRN, date of birth, ID band Patient awake    Reviewed: Allergy & Precautions, NPO status , Patient's Chart, lab work & pertinent test results  History of Anesthesia Complications Negative for: history of anesthetic complications  Airway Mallampati: II  TM Distance: >3 FB Neck ROM: Full    Dental  (+) Dental Advisory Given, Missing   Pulmonary sleep apnea and Continuous Positive Airway Pressure Ventilation , former smoker   breath sounds clear to auscultation       Cardiovascular (-) angina negative cardio ROS  Rhythm:Regular Rate:Normal     Neuro/Psych  Headaches  Anxiety        GI/Hepatic negative GI ROS, Neg liver ROS,,Patient did not received Oral Contrast Agents,  Endo/Other    Morbid obesityBMI 47  Renal/GU negative Renal ROS     Musculoskeletal   Abdominal  (+) + obese  Peds  Hematology negative hematology ROS (+)   Anesthesia Other Findings   Reproductive/Obstetrics                             Anesthesia Physical Anesthesia Plan  ASA: 3  Anesthesia Plan: General   Post-op Pain Management: Tylenol PO (pre-op)*   Induction: Intravenous  PONV Risk Score and Plan: 2 and Ondansetron and Dexamethasone  Airway Management Planned: Oral ETT  Additional Equipment: None  Intra-op Plan:   Post-operative Plan: Extubation in OR  Informed Consent: I have reviewed the patients History and Physical, chart, labs and discussed the procedure including the risks, benefits and alternatives for the proposed anesthesia with the patient or authorized representative who has indicated his/her understanding and acceptance.     Dental advisory given and Interpreter used for interveiw  Plan Discussed with: CRNA and Surgeon  Anesthesia Plan Comments:         Anesthesia Quick Evaluation

## 2022-10-25 NOTE — Op Note (Signed)
Preoperative diagnosis: Roux-en-Y gastric bypass  Postoperative diagnosis: Same   Procedure: Upper endoscopy   Surgeon: Clovis Riley, M.D.  Anesthesia: Gen.   Description of procedure: The endoscope was placed in the mouth and oropharynx and under endoscopic vision it was advanced to the esophagogastric junction which was identified at 42cm from the teeth.  The pouch was tensely insufflated while the upper abdomen was flooded with irrigation to perform a leak test, which was negative. No bubbles were seen.  The staple line was hemostatic and the anastomosis is visibly patent. The pouch measures 5cm in length and there is no appreciable retained fundus. The lumen was decompressed and the scope was withdrawn without difficulty.    Clovis Riley, M.D. General, Bariatric, & Minimally Invasive Surgery [facility] [place]

## 2022-10-25 NOTE — Discharge Instructions (Signed)

## 2022-10-25 NOTE — Transfer of Care (Signed)
Immediate Anesthesia Transfer of Care Note  Patient: William Rose  Procedure(s) Performed: LAPAROSCOPIC ROUX-EN-Y GASTRIC BYPASS WITH UPPER ENDOSCOPY  Patient Location: PACU  Anesthesia Type:General  Level of Consciousness: awake, alert , and oriented  Airway & Oxygen Therapy: Patient Spontanous Breathing and Patient connected to face mask oxygen  Post-op Assessment: Report given to RN and Post -op Vital signs reviewed and stable  Post vital signs: Reviewed and stable  Last Vitals:  Vitals Value Taken Time  BP 166/129 10/25/22 1006  Temp    Pulse 90 10/25/22 1007  Resp 18 10/25/22 1007  SpO2 100 % 10/25/22 1007  Vitals shown include unvalidated device data.  Last Pain:  Vitals:   10/25/22 0555  TempSrc:   PainSc: 0-No pain         Complications: No notable events documented.

## 2022-10-25 NOTE — Progress Notes (Signed)
Discussed QI "Goals for Discharge" document with patient including ambulation in halls, Incentive Spirometry use every hour, and oral care.  Also discussed pain and nausea control.  Enabled or verified head of bed 30 degree alarm activated.  BSTOP education provided including BSTOP information guide, "Guide for Pain Management after your Bariatric Procedure".  Diet progression education provided including "Bariatric Surgery Post-Op Food Plan Phase 1: Liquids".  Questions answered.  Will continue to partner with bedside RN and follow up with patient per protocol.   Interpreter utilized via Office manager. Pt and family were receptive.

## 2022-10-25 NOTE — Anesthesia Procedure Notes (Signed)
Procedure Name: Intubation Date/Time: 10/25/2022 7:43 AM  Performed by: Maxwell Caul, CRNAPre-anesthesia Checklist: Patient identified, Emergency Drugs available, Suction available and Patient being monitored Patient Re-evaluated:Patient Re-evaluated prior to induction Oxygen Delivery Method: Circle system utilized Preoxygenation: Pre-oxygenation with 100% oxygen Induction Type: IV induction Ventilation: Mask ventilation without difficulty Laryngoscope Size: Mac and 4 Grade View: Grade II Tube type: Oral Tube size: 7.5 mm Number of attempts: 1 Airway Equipment and Method: Stylet Placement Confirmation: ETT inserted through vocal cords under direct vision, positive ETCO2 and breath sounds checked- equal and bilateral Secured at: 21 cm Tube secured with: Tape Dental Injury: Teeth and Oropharynx as per pre-operative assessment

## 2022-10-25 NOTE — H&P (Signed)
PROVIDER: Tawana Pasch Leanne Chang, MD  MRN: [id number] DOB: 16-Sep-1965 DATE OF ENCOUNTER: 10/14/2022 Subjective  Chief Complaint: Follow-up (RETURN WEIGHT LOSS)  He is accompanied by an interpreter  History of Present Illness: William Rose is a 57 y.o. male who is seen today for long-term follow-up regarding his severe obesity and related comorbidities..  His comorbidities include prediabetes, obstructive sleep apnea on CPAP, low testosterone  This is his third visit. I initially met him back in October to discuss bariatric surgery and then we brought him back in November for additional detailed discussion regarding bariatric surgery with the assistance of an in person interpreter. He chose to proceed with Roux-en-Y gastric bypass  States that he came off of semaglutide in December because they could not find it. He states as a result his appetite increased and he has gained some weight.  Ros he denies any changes. He is not planning to leave the country until July. He states he has attended his preoperative education class. Denies any chest pain, chest pressure, shortness of breath, abdominal pain. He denies any right upper quadrant pain. He denies any postprandial pain  Review of Systems: A complete review of systems was obtained from the patient. I have reviewed this information and discussed as appropriate with the patient. See HPI as well for other ROS.  ROS  Medical History: History reviewed. No pertinent past medical history.  Patient Active Problem List Diagnosis Low testosterone in male HLD (hyperlipidemia) Prediabetes  Past Surgical History: Procedure Laterality Date carpal tunnel surgery Bilateral   No Known Allergies  Current Outpatient Medications on File Prior to Visit Medication Sig Dispense Refill anastrozole (ARIMIDEX) 1 mg tablet ergocalciferol, vitamin D2, 1,250 mcg (50,000 unit) capsule Take 1 capsule (50,000 Units total) by mouth once a week for 30  days (Patient not taking: Reported on 06/17/2022) 4 capsule 0 ibuprofen (MOTRIN) 200 MG tablet Take 200 mg by mouth every 6 (six) hours as needed for Pain  No current facility-administered medications on file prior to visit.  Family History Problem Relation Age of Onset Diabetes Mother Obesity Mother Obesity Father   Social History  Tobacco Use Smoking Status Former Types: Cigarettes Quit date: 01/2018 Years since quitting: 4.7 Smokeless Tobacco Not on file   Social History  Socioeconomic History Marital status: Married Tobacco Use Smoking status: Former Types: Cigarettes Quit date: 01/2018 Years since quitting: 4.7 Substance and Sexual Activity Alcohol use: Not Currently Drug use: Never  Objective:  Vitals: 10/14/22 1557 BP: 133/87 Pulse: 93 Temp: 36.7 C (98 F) SpO2: 98% Weight: (!) 142.8 kg (314 lb 12.8 oz) Height: 170.2 cm (5\' 7" )  Body mass index is 49.3 kg/m.  Gen: alert, NAD, non-toxic appearing; obesity Pupils: equal, no scleral icterus Pulm: Lungs clear to auscultation, symmetric chest rise CV: regular rate and rhythm Abd: soft, nontender, nondistended. No cellulitis. No incisional hernia Ext: no edema, Skin: no rash, no jaundice  Labs, Imaging and Diagnostic Testing:  Labs from May 14, 2022 were reviewed. Lipid panel revealed low HDL of 36, LDL elevated at 106, H. pylori breath test negative. CBC normal except for a mildly elevated white blood cell count of an. Vitamin D level low at 19, hemoglobin A1c 5.6. Comprehensive metabolic panel unremarkable.  upPer GI 05/21/22 showed no motility or hiatal hernia no reflux. Questionable gallstone in the right upper quadrant. Chest x-ray unremarkable  FLUOROSCOPY: Radiation Exposure Index (as provided by the fluoroscopic device): 65.10 mGy Kerma  COMPARISON: None Available.  FINDINGS: Scout Radiograph: Normal bowel  gas pattern. Calcifications overlie the right upper quadrant.  Esophagus:  Normal appearance.  Esophageal motility: Within normal limits.  Gastroesophageal reflux: None visualized.  Ingested 13 mm barium tablet: Passed normally  Stomach: Normal appearance. No hiatal hernia.  Gastric emptying: Normal.  Duodenum: Normal appearance.  Other: None.  IMPRESSION: Normal upper GI exam.  PCP office note June 25, 2022  Assessment and Plan: Diagnoses and all orders for this visit:  Severe obesity (CMS-HCC)  Prediabetes  Low testosterone in male  Low HDL (under 40)  Vitamin D deficiency  Hyperlipidemia, unspecified hyperlipidemia type    We reviewed his workup again. He has asymptomatic cholelithiasis. We discussed observation on that. We discussed that he could restart his Mancel Parsons about a month after surgery. We reviewed the typical hospitalization. He read over and signed the surgical consent form with the aid of the interpreter. I offered to rediscussed the steps of the procedure along with risk and he declined. He states that he was comfortable with what we have discussed. We rediscussed the typical recovery and the typical hospitalization. All of his questions were asked and answered.  This patient encounter took 25 minutes today to perform the following: take history, perform exam, review outside records, interpret imaging, counsel the patient on their diagnosis and document encounter, findings & plan in the EHR  No follow-ups on file.  [name]. Redmond Pulling MD FACS General, Minimally Invasive, & Bariatric Surgery Electronically signed by Rudean Curt, MD at 10/14/2022 4:46 PM EST

## 2022-10-25 NOTE — Addendum Note (Signed)
Addendum  created 10/25/22 1055 by Annye Asa, MD   Clinical Note Signed

## 2022-10-25 NOTE — Anesthesia Postprocedure Evaluation (Addendum)
Anesthesia Post Note  Patient: William Rose  Procedure(s) Performed: LAPAROSCOPIC ROUX-EN-Y GASTRIC BYPASS WITH UPPER ENDOSCOPY     Patient location during evaluation: PACU Anesthesia Type: General Level of consciousness: sedated, patient cooperative and oriented Pain management: pain level controlled Vital Signs Assessment: post-procedure vital signs reviewed and stable Respiratory status: spontaneous breathing, nonlabored ventilation and respiratory function stable Cardiovascular status: blood pressure returned to baseline and stable : nausea resolved. Anesthetic complications: no   No notable events documented.  Last Vitals:  Vitals:   10/25/22 1030 10/25/22 1045  BP: (!) 165/103 (!) 168/92  Pulse: 96 94  Resp: 14 16  Temp:    SpO2: 97% 97%    Last Pain:  Vitals:   10/25/22 1045  TempSrc:   PainSc: 7                  Cheria Sadiq,E. Kyrra Prada

## 2022-10-26 ENCOUNTER — Encounter (HOSPITAL_COMMUNITY): Payer: Self-pay | Admitting: General Surgery

## 2022-10-26 ENCOUNTER — Other Ambulatory Visit (HOSPITAL_COMMUNITY): Payer: Self-pay

## 2022-10-26 LAB — COMPREHENSIVE METABOLIC PANEL
ALT: 37 U/L (ref 0–44)
AST: 29 U/L (ref 15–41)
Albumin: 4.2 g/dL (ref 3.5–5.0)
Alkaline Phosphatase: 41 U/L (ref 38–126)
Anion gap: 11 (ref 5–15)
BUN: 13 mg/dL (ref 6–20)
CO2: 22 mmol/L (ref 22–32)
Calcium: 8.5 mg/dL — ABNORMAL LOW (ref 8.9–10.3)
Chloride: 101 mmol/L (ref 98–111)
Creatinine, Ser: 0.64 mg/dL (ref 0.61–1.24)
GFR, Estimated: >60 mL/min (ref >60)
Glucose, Bld: 158 mg/dL — ABNORMAL HIGH (ref 70–99)
Potassium: 3.8 mmol/L (ref 3.5–5.1)
Sodium: 134 mmol/L — ABNORMAL LOW (ref 135–145)
Total Bilirubin: 0.8 mg/dL (ref 0.3–1.2)
Total Protein: 7.2 g/dL (ref 6.5–8.1)

## 2022-10-26 LAB — CBC WITH DIFFERENTIAL/PLATELET
Abs Immature Granulocytes: 0.08 10*3/uL — ABNORMAL HIGH (ref 0.00–0.07)
Basophils Absolute: 0 10*3/uL (ref 0.0–0.1)
Basophils Relative: 0 %
Eosinophils Absolute: 0 10*3/uL (ref 0.0–0.5)
Eosinophils Relative: 0 %
HCT: 43 % (ref 39.0–52.0)
Hemoglobin: 14.6 g/dL (ref 13.0–17.0)
Immature Granulocytes: 1 %
Lymphocytes Relative: 18 %
Lymphs Abs: 2.8 10*3/uL (ref 0.7–4.0)
MCH: 29.7 pg (ref 26.0–34.0)
MCHC: 34 g/dL (ref 30.0–36.0)
MCV: 87.4 fL (ref 80.0–100.0)
Monocytes Absolute: 0.9 10*3/uL (ref 0.1–1.0)
Monocytes Relative: 6 %
Neutro Abs: 11.5 10*3/uL — ABNORMAL HIGH (ref 1.7–7.7)
Neutrophils Relative %: 75 %
Platelets: 343 10*3/uL (ref 150–400)
RBC: 4.92 MIL/uL (ref 4.22–5.81)
RDW: 14.5 % (ref 11.5–15.5)
WBC: 15.4 10*3/uL — ABNORMAL HIGH (ref 4.0–10.5)
nRBC: 0 % (ref 0.0–0.2)

## 2022-10-26 LAB — EXTERNAL GENERIC LAB PROCEDURE: COLOGUARD: NEGATIVE

## 2022-10-26 LAB — COLOGUARD: COLOGUARD: NEGATIVE

## 2022-10-26 NOTE — Progress Notes (Signed)
1 Day Post-Op   Subjective/Chief Complaint: Doing well.  Had nausea yesterday.  But it is better.  Tolerated water but protein is going slow   Objective: Vital signs in last 24 hours: Temp:  [97.7 F (36.5 C)-98.9 F (37.2 C)] 98.2 F (36.8 C) (03/19 1115) Pulse Rate:  [75-106] 83 (03/19 1115) Resp:  [16-20] 16 (03/19 1115) BP: (144-187)/(91-106) 156/100 (03/19 1115) SpO2:  [96 %-99 %] 99 % (03/19 1115) FiO2 (%):  [21 %] 21 % (03/18 2142) Last BM Date : 10/25/22  Intake/Output from previous day: 03/18 0701 - 03/19 0700 In: 4243.5 [P.O.:430; I.V.:3713.5; IV Piggyback:100] Out: 4825 [Urine:4800; Blood:25] Intake/Output this shift: Total I/O In: 1240.2 [P.O.:300; I.V.:940.2] Out: 850 [Urine:850]  Alert, no apparent distress, sitting in bedside chair Nonlabored Obese, soft, mild appropriate tenderness, incisions okay  Lab Results:  Recent Labs    10/25/22 1200 10/25/22 1330 10/26/22 0501  WBC 17.0*  --  15.4*  HGB 15.3 15.1 14.6  HCT 45.9 46.6 43.0  PLT 327  --  343   BMET Recent Labs    10/26/22 0501  NA 134*  K 3.8  CL 101  CO2 22  GLUCOSE 158*  BUN 13  CREATININE 0.64  CALCIUM 8.5*   PT/INR No results for input(s): "LABPROT", "INR" in the last 72 hours. ABG No results for input(s): "PHART", "HCO3" in the last 72 hours.  Invalid input(s): "PCO2", "PO2"  Studies/Results: No results found.  Anti-infectives: Anti-infectives (From admission, onward)    Start     Dose/Rate Route Frequency Ordered Stop   10/25/22 0600  cefoTEtan (CEFOTAN) 2 g in sodium chloride 0.9 % 100 mL IVPB        2 g 200 mL/hr over 30 Minutes Intravenous On call to O.R. 10/25/22 0533 10/25/22 0744       Assessment/Plan: s/p Procedure(s): LAPAROSCOPIC ROUX-EN-Y GASTRIC BYPASS WITH UPPER ENDOSCOPY (N/A)  Does not meet criteria for discharge due to inadequate protein intake Continue chemical VTE prophylaxis Ambulate, pulmonary toilet IV fluids Discussed some post  discharge expectations Patient interviewed and discussed and answered questions with the online interpreter  LOS: 1 day    William Rose 10/26/2022

## 2022-10-26 NOTE — TOC CM/SW Note (Signed)
  Transition of Care [facility]) Screening Note   Patient Details  Name: William Rose Date of Birth: Dec 08, 1965   Transition of Care [facility]) CM/SW Contact:    Lennart Pall, LCSW Phone Number: 10/26/2022, 10:42 AM    Transition of Care Department [facility]) has reviewed patient and no TOC needs have been identified at this time. We will continue to monitor patient advancement through interdisciplinary progression rounds. If new patient transition needs arise, please place a TOC consult.

## 2022-10-26 NOTE — Progress Notes (Signed)
Rounded w/patient. Waiting for the wife to return to complete dc education  Pt making progress towards meeting protein goal, still feeling some discomfort in the chest and abdomen area. Sitting upright.

## 2022-10-27 ENCOUNTER — Other Ambulatory Visit (HOSPITAL_COMMUNITY): Payer: Self-pay

## 2022-10-27 ENCOUNTER — Encounter (HOSPITAL_COMMUNITY): Payer: Self-pay | Admitting: *Deleted

## 2022-10-27 LAB — CBC WITH DIFFERENTIAL/PLATELET
Abs Immature Granulocytes: 0.04 10*3/uL (ref 0.00–0.07)
Basophils Absolute: 0 10*3/uL (ref 0.0–0.1)
Basophils Relative: 0 %
Eosinophils Absolute: 0.1 10*3/uL (ref 0.0–0.5)
Eosinophils Relative: 1 %
HCT: 43.2 % (ref 39.0–52.0)
Hemoglobin: 14.2 g/dL (ref 13.0–17.0)
Immature Granulocytes: 0 %
Lymphocytes Relative: 28 %
Lymphs Abs: 3.1 10*3/uL (ref 0.7–4.0)
MCH: 29.4 pg (ref 26.0–34.0)
MCHC: 32.9 g/dL (ref 30.0–36.0)
MCV: 89.4 fL (ref 80.0–100.0)
Monocytes Absolute: 0.7 10*3/uL (ref 0.1–1.0)
Monocytes Relative: 6 %
Neutro Abs: 7.2 10*3/uL (ref 1.7–7.7)
Neutrophils Relative %: 65 %
Platelets: 307 10*3/uL (ref 150–400)
RBC: 4.83 MIL/uL (ref 4.22–5.81)
RDW: 14.8 % (ref 11.5–15.5)
WBC: 11.1 10*3/uL — ABNORMAL HIGH (ref 4.0–10.5)
nRBC: 0 % (ref 0.0–0.2)

## 2022-10-27 MED ORDER — TRAMADOL HCL 50 MG PO TABS
50.0000 mg | ORAL_TABLET | Freq: Four times a day (QID) | ORAL | 0 refills | Status: DC | PRN
  Filled 2022-10-27: qty 10, 3d supply, fill #0

## 2022-10-27 MED ORDER — PANTOPRAZOLE SODIUM 40 MG PO TBEC
40.0000 mg | DELAYED_RELEASE_TABLET | Freq: Every day | ORAL | 0 refills | Status: DC
  Filled 2022-10-27: qty 90, 90d supply, fill #0

## 2022-10-27 MED ORDER — ACETAMINOPHEN 500 MG PO TABS: 1000.0000 mg | ORAL_TABLET | Freq: Three times a day (TID) | ORAL | 0 refills | Status: AC

## 2022-10-27 MED ORDER — ONDANSETRON 4 MG PO TBDP
4.0000 mg | ORAL_TABLET | Freq: Four times a day (QID) | ORAL | 0 refills | Status: DC | PRN
  Filled 2022-10-27: qty 20, 5d supply, fill #0

## 2022-10-27 NOTE — Progress Notes (Signed)
Pt was discharged home today. Instructions were reviewed with patient, and questions were answered. Pt was taken to main entrance via wheelchair by NT.  

## 2022-10-27 NOTE — Progress Notes (Signed)
Pt requested excuse note from work. Communicated request to MD, acknowledged, excuse note provided to pt.

## 2022-10-27 NOTE — Progress Notes (Signed)
Rounded w/ patient pt reported feeling "muy bien" much better on today and is ready for dc. Communicated w/ healthcare team readiness for dc.  Patient alert and oriented, pain is controlled. Patient is tolerating fluids, tolerating protein shake well. Reviewed Gastric sleeve/bypass discharge instructions on yesterday with patient & spouse and patient is able to articulate understanding. Provided information on BELT program, Support Group and [facility] outpatient pharmacy. Communicated general update of patient status to surgeon. All questions answered. 24hr fluid recall is 690 mL per hydration protocol, bariatric nurse coordinator to make follow-up phone call within one week.

## 2022-10-27 NOTE — Plan of Care (Signed)
  Problem: Education: Goal: Knowledge of General Education information will improve Description: Including pain rating scale, medication(s)/side effects and non-pharmacologic comfort measures Outcome: Adequate for Discharge   Problem: Health Behavior/Discharge Planning: Goal: Ability to manage health-related needs will improve Outcome: Adequate for Discharge   Problem: Clinical Measurements: Goal: Ability to maintain clinical measurements within normal limits will improve Outcome: Adequate for Discharge Goal: Will remain free from infection Outcome: Adequate for Discharge Goal: Diagnostic test results will improve Outcome: Adequate for Discharge Goal: Respiratory complications will improve Outcome: Adequate for Discharge Goal: Cardiovascular complication will be avoided Outcome: Adequate for Discharge   Problem: Activity: Goal: Risk for activity intolerance will decrease Outcome: Adequate for Discharge   Problem: Nutrition: Goal: Adequate nutrition will be maintained Outcome: Adequate for Discharge   Problem: Coping: Goal: Level of anxiety will decrease Outcome: Adequate for Discharge   Problem: Elimination: Goal: Will not experience complications related to bowel motility Outcome: Adequate for Discharge Goal: Will not experience complications related to urinary retention Outcome: Adequate for Discharge   Problem: Pain Managment: Goal: General experience of comfort will improve Outcome: Adequate for Discharge   Problem: Safety: Goal: Ability to remain free from injury will improve Outcome: Adequate for Discharge   Problem: Skin Integrity: Goal: Risk for impaired skin integrity will decrease Outcome: Adequate for Discharge   Problem: Education: Goal: Ability to state signs and symptoms to report to health care provider will improve Outcome: Adequate for Discharge Goal: Knowledge of the prescribed self-care regimen will improve Outcome: Adequate for  Discharge Goal: Knowledge of discharge needs will improve Outcome: Adequate for Discharge   Problem: Activity: Goal: Ability to tolerate increased activity will improve Outcome: Adequate for Discharge   Problem: Bowel/Gastric: Goal: Gastrointestinal status for postoperative course will improve Outcome: Adequate for Discharge Goal: Occurrences of nausea will decrease Outcome: Adequate for Discharge   Problem: Coping: Goal: Development of coping mechanisms to deal with changes in body function or appearance will improve Outcome: Adequate for Discharge   Problem: Fluid Volume: Goal: Maintenance of adequate hydration will improve Outcome: Adequate for Discharge   Problem: Nutritional: Goal: Nutritional status will improve Outcome: Adequate for Discharge   Problem: Clinical Measurements: Goal: Will show no signs or symptoms of venous thromboembolism Outcome: Adequate for Discharge Goal: Will remain free from infection Outcome: Adequate for Discharge Goal: Will show no signs of GI Leak Outcome: Adequate for Discharge   Problem: Respiratory: Goal: Will regain and/or maintain adequate ventilation Outcome: Adequate for Discharge   Problem: Pain Management: Goal: Pain level will decrease Outcome: Adequate for Discharge   Problem: Skin Integrity: Goal: Demonstration of wound healing without infection will improve Outcome: Adequate for Discharge   

## 2022-10-28 ENCOUNTER — Telehealth: Payer: Self-pay

## 2022-10-28 NOTE — Transitions of Care (Post Inpatient/ED Visit) (Signed)
   10/28/2022  Name: William Rose MRN: [id number] DOB: Dec 13, 1965  Today's TOC FU Call Status:    Transition Care Management Follow-up Telephone Call Date of Discharge: 10/27/22 Discharge Facility: [facility]) Type of Discharge: Inpatient Admission Primary Inpatient Discharge Diagnosis:: prediabetes How have you been since you were released from the hospital?: Better Any questions or concerns?: No  Items Reviewed: Did you receive and understand the discharge instructions provided?: Yes Medications obtained and verified?: Yes (Medications Reviewed) Any new allergies since your discharge?: No Dietary orders reviewed?: Yes Do you have support at home?: Yes People in Home: spouse  Home Care and Equipment/Supplies: [place] Ordered?: NA Any new equipment or medical supplies ordered?: NA  Functional Questionnaire: Do you need assistance with bathing/showering or dressing?: No Do you need assistance with meal preparation?: No Do you need assistance with eating?: No Do you have difficulty maintaining continence: No Do you need assistance with getting out of bed/getting out of a chair/moving?: No Do you have difficulty managing or taking your medications?: No  Follow up appointments reviewed: PCP Follow-up appointment confirmed?: Yes Date of PCP follow-up appointment?: 11/25/22 Follow-up Provider: Dr Zigmund Daniel [facility] Follow-up appointment confirmed?: Yes Date of Specialist follow-up appointment?: 11/17/22 Follow-Up Specialty Provider:: Dr Redmond Pulling Do you need transportation to your follow-up appointment?: No Do you understand care options if your condition(s) worsen?: Yes-patient verbalized understanding    [place], [place] Direct Dial [phone]

## 2022-10-28 NOTE — Discharge Summary (Signed)
Physician Discharge Summary  William Rose [id number] DOB: 11-10-1965 DOA: 10/25/2022  PCP: Luetta Nutting, DO  Admit date: 10/25/2022 Discharge date: 10/27/2022  Recommendations for Outpatient Follow-up:     Follow-up Information     Greer Pickerel, MD. Go on 11/17/2022.   Specialty: General Surgery Why: Please arrive 15 minutes prior to your appointment at 11:15 am Contact information: [place] [id number] [phone]         Greer Pickerel, MD Follow up on 12/21/2022.   Specialty: General Surgery Why: Please arrive 15 minutes prior to your appointment at 2:15pm Contact information: [place] [phone] [phone]         [facility] Follow up on 10/28/2022.   Why: [facility] is on every 3rd Thur. @ 6p Please let us know if advance if you plan to attend so we can get an interpreter. Contact information: [employer]               Discharge Diagnoses:  Principal Problem:   S/P gastric bypass Severe obesity (BMI 47)  Prediabetes  Low testosterone in male  Low HDL (under 40)  Vitamin D deficiency  Hyperlipidemia, unspecified hyperlipidemia type   Surgical Procedure: Laparoscopic Roux-en-Y gastric bypass, upper endoscopy  Discharge Condition: Good Disposition: Home  Diet recommendation: Postoperative gastric bypass diet  Filed Weights   10/25/22 0555  Weight: (!) 139.3 kg     Hospital Course:  The patient was admitted for a planned laparoscopic Roux-en-Y gastric bypass. Please see operative note. Preoperatively the patient was given 5000 units of subcutaneous heparin for DVT prophylaxis. ERAS protocol was used. Postoperative prophylactic heparin dosing was started on the evening of postoperative day 0.  The patient was started on ice chips and water on the evening of POD 0 which they tolerated. On postoperative day 1 The patient's diet was advanced  to protein shakes which they also tolerated. On POD 2, The patient was ambulating without difficulty. Their vital signs are stable without fever or tachycardia. Their hemoglobin had remained stable.  The patient had received discharge instructions and counseling in spanish. They were deemed stable for discharge.  BP 119/85 (BP Location: Left Arm)   Pulse 88   Temp 98 F (36.7 C) (Oral)   Resp 17   Ht 5' 7.72" (1.72 m)   Wt (!) 139.3 kg   SpO2 96%   BMI 47.07 kg/m   Gen: alert, NAD, non-toxic appearing Pupils: equal, no scleral icterus Pulm: Lungs clear to auscultation, symmetric chest rise CV: regular rate and rhythm Abd: soft, min tender, nondistended. No cellulitis. No incisional hernia Ext: no edema, no calf tenderness Skin: no rash, no jaundice  Discharge Instructions  Discharge Instructions     Ambulate hourly while awake   Complete by: As directed    Call MD for:  difficulty breathing, headache or visual disturbances   Complete by: As directed    Call MD for:  persistant dizziness or light-headedness   Complete by: As directed    Call MD for:  persistant nausea and vomiting   Complete by: As directed    Call MD for:  redness, tenderness, or signs of infection (pain, swelling, redness, odor or green/yellow discharge around incision site)   Complete by: As directed    Call MD for:  severe uncontrolled pain   Complete by: As directed    Call MD for:  temperature >101 F  Complete by: As directed    Diet bariatric full liquid   Complete by: As directed    Discharge instructions   Complete by: As directed    See bariatric discharge instructions   Incentive spirometry   Complete by: As directed    Perform hourly while awake      Allergies as of 10/27/2022   No Known Allergies      Medication List     STOP taking these medications    ibuprofen 200 MG tablet Commonly known as: ADVIL   Semaglutide (2 MG/DOSE) 8 MG/3ML Sopn       TAKE these medications     acetaminophen 500 MG tablet Commonly known as: TYLENOL Take 2 tablets (1,000 mg total) by mouth every 8 (eight) hours for 5 days.   ondansetron 4 MG disintegrating tablet Commonly known as: ZOFRAN-ODT Dissolve 1 tablet by mouth every 6 hours as needed for nausea or vomiting.   pantoprazole 40 MG tablet Commonly known as: PROTONIX Tome 1 tableta (40 mg en total) por va oral diariamente. (Take 1 tablet (40 mg total) by mouth daily.)   traMADol 50 MG tablet Commonly known as: ULTRAM Take 1 tablet by mouth every 6 hours as needed (pain).        Follow-up Information     Greer Pickerel, MD. Go on 11/17/2022.   Specialty: General Surgery Why: Please arrive 15 minutes prior to your appointment at 11:15 am Contact information: [place] [phone] [phone]         Greer Pickerel, MD Follow up on 12/21/2022.   Specialty: General Surgery Why: Please arrive 15 minutes prior to your appointment at 2:15pm Contact information: [place] [phone] [phone]         [facility] Follow up on 10/28/2022.   Why: [facility] is on every 3rd Thur. @ 6p Please let us know if advance if you plan to attend so we can get an interpreter. Contact information: [employer]                 The results of significant diagnostics from this hospitalization (including imaging, microbiology, ancillary and laboratory) are listed below for reference.    Significant Diagnostic Studies: No results found.  Labs: Basic Metabolic Panel: Recent Labs  Lab 10/22/22 1117 10/26/22 0501  NA 137 134*  K 3.8 3.8  CL 104 101  CO2 25 22  GLUCOSE 166* 158*  BUN 19 13  CREATININE 0.82 0.64  CALCIUM 8.6* 8.5*   Liver Function Tests: Recent Labs  Lab 10/22/22 1117 10/26/22 0501  AST 22 29  ALT 22 37  ALKPHOS 42 41  BILITOT 1.1 0.8  PROT 6.8 7.2  ALBUMIN 4.2 4.2    CBC: Recent Labs   Lab 10/22/22 1117 10/25/22 1200 10/25/22 1330 10/26/22 0501 10/27/22 0444  WBC 7.4 17.0*  --  15.4* 11.1*  NEUTROABS 4.4  --   --  11.5* 7.2  HGB 14.9 15.3 15.1 14.6 14.2  HCT 45.2 45.9 46.6 43.0 43.2  MCV 88.8 89.3  --  87.4 89.4  PLT 292 327  --  343 307    CBG: No results for input(s): "GLUCAP" in the last 168 hours.  Principal Problem:   S/P gastric bypass   Time coordinating discharge: 20 min  Signed:  Gayland Curry, MD [facility], [place] [phone] 10/28/2022, 10:06 AM

## 2022-10-29 ENCOUNTER — Telehealth (HOSPITAL_COMMUNITY): Payer: Self-pay | Admitting: *Deleted

## 2022-10-29 NOTE — Telephone Encounter (Signed)
  This RN contacted the Interpreter Line at [phone] prior to calling the pt.  The pt was connected on the phone call with Claris Pong the interpreter The following questions were addressed: 1. Tell me about your pain and pain management?    Pt reported pain at a level of 2 but did not express desire to take any additional pain medication at this time.  Discussed with patient to try and splint his/her abdomen with changing positions to assist with the discomfort. Encouraged pt to try getting into a comfortable position and remaining upright, especially after eating/drinking, and/or contact [facility] if still concerned.   2. Let's talk about fluid intake. How much total fluid are you taking in?   On yesterday the pt took 38 oz of liquids plus 30 oz of protein for a total of 68 oz and for today the pt received 2 bottles of water. Pt is working towards meet goal of 64 oz of fluid today. Pt encouraged to continue to work towards meeting goal. Pt instructed to assess status and suggestions daily utilizing Hydration Action Plan on discharge folder and to call [facility] if in the "red zone".    3. How much protein have you taken in the last day?    Pt states he is exceeding his goal of 90g of protein each day with the protein shakes    4. Have you had nausea? Tell me about when you have experienced nausea and what you did to help?   Pt denies nausea.   5. Has the frequency or color changed with your urine?   Pt states that s/he is urinating "fine" with no changes in frequency or urgency.   6. Tell me what your incisions look like?   "Incisions look fine". Pt denies a fever, chills. Pt did not report any incisions as being swollen, open, or draining. Pt encouraged to call [facility] if incisions change.    7. Have you been passing gas? BM?   Pt states that they have not had a BM. Pt instructed to take either Miralax or MoM as instructed per "Gastric Bypass/Sleeve Discharge Home Care Instructions". Pt to call  surgeon's office if not able to have BM with medication.     8. If a problem or question were to arise who would you call? Do you know contact numbers for [place], [facility], and [facility]?   Pt knows to call [facility] for surgical, [facility] for nutrition, and [place] for non-urgent questions or concerns. Pt denies dehydration symptoms. Pt can describe s/sx of dehydration.   9. How is the walking going?   Pt states s/he is walking around and able to be active without difficulty.   10. Are you still using your incentive spirometer? If so, how often?   Pt states that he is doing the I.S. Pt encouraged to use incentive spirometer, around 10x every hour while awake until s/he sees the surgeon.   11. How are your vitamins and calcium going? How are you taking them?    Pt states that s/he is taking his/her supplements and vitamins without difficulty.    Thank you,  Calton Dach, RN, MSN Bariatric Nurse Coordinator [phone] (office)

## 2022-11-09 ENCOUNTER — Encounter: Payer: Self-pay | Admitting: Dietician

## 2022-11-09 ENCOUNTER — Encounter: Payer: BC Managed Care – PPO | Attending: General Surgery | Admitting: Dietician

## 2022-11-09 VITALS — Ht 67.0 in | Wt 287.2 lb

## 2022-11-09 DIAGNOSIS — E669 Obesity, unspecified: Secondary | ICD-10-CM | POA: Diagnosis not present

## 2022-11-09 NOTE — Progress Notes (Signed)
2 Week Post-Operative Nutrition Class   Patient was seen on 11/09/2022 for Post-Operative Nutrition education at the [facility].    Surgery date: 10/25/2022 Surgery type: RYGB  Anthropometrics  Start weight at [facility]: 300.0 lbs (date: 06/01/2022) or 136.3 kg Height: 67 in Weight today: 287.2 lbs. (130.5 kg) BMI: 44.98 kg/m2     Clinical  Medical hx: Obesity, sleep apnea Medications: Vit D, semaglutide   Labs: A1C 5.7; HDL 36; LDL 106 Notable signs/symptoms: nothing noted Any previous deficiencies? No Bowel Habits: some constipation; using Miralax    Body Composition Scale 11/09/2022  Current Body Weight 287.2  Total Body Fat % 39.1  Visceral Fat 36  Fat-Free Mass % 60.8   Total Body Water % 41.8  Muscle-Mass lbs 54.2  BMI 46.3  Body Fat Displacement          Torso  lbs 69.8         Left Leg  lbs 13.9         Right Leg  lbs 13.9         Left Arm  lbs 6.9         Right Arm  lbs 6.9      The following the learning objectives were met by the patient during this course: Identifies Soft Prepped Plan Advancement Guide  Identifies Soft, High Proteins (Phase 1), beginning 2 weeks post-operatively to 3 weeks post-operatively Identifies Additional Soft High Proteins, soft non-starchy vegetables, fruits and starches (Phase 2), beginning 3 weeks post-operatively to 3 months post-operatively Identifies appropriate sources of fluids, proteins, vegetables, fruits and starches Identifies appropriate fat sources and healthy verses unhealthy fat types   States protein, vegetable, fruit and starch recommendations and appropriate sources post-operatively Identifies the need for appropriate texture modifications, mastication, and bite sizes when consuming solids Identifies appropriate fat consumption and sources Identifies appropriate multivitamin and calcium sources post-operatively Describes the need for physical activity post-operatively and will follow MD  recommendations States when to call healthcare provider regarding medication questions or post-operative complications   Handouts given during class include: Soft Prepped Plan Advancement Guide   Follow-Up Plan: Patient will follow-up at [facility] in 10 weeks for 3 month post-op nutrition visit for diet advancement per MD.

## 2022-11-25 ENCOUNTER — Encounter: Payer: Self-pay | Admitting: Family Medicine

## 2022-11-25 ENCOUNTER — Ambulatory Visit (INDEPENDENT_AMBULATORY_CARE_PROVIDER_SITE_OTHER): Payer: BC Managed Care – PPO | Admitting: Family Medicine

## 2022-11-25 VITALS — BP 120/80 | HR 71 | Ht 67.0 in | Wt 281.0 lb

## 2022-11-25 DIAGNOSIS — M7661 Achilles tendinitis, right leg: Secondary | ICD-10-CM

## 2022-11-25 DIAGNOSIS — L219 Seborrheic dermatitis, unspecified: Secondary | ICD-10-CM

## 2022-11-25 DIAGNOSIS — Z9884 Bariatric surgery status: Secondary | ICD-10-CM

## 2022-11-25 DIAGNOSIS — R7989 Other specified abnormal findings of blood chemistry: Secondary | ICD-10-CM | POA: Diagnosis not present

## 2022-11-25 NOTE — Patient Instructions (Signed)
Try selenium sulfide shampoo on rash on face.   Dermatitis seborreica en los adultos Seborrheic Dermatitis, Adult La dermatitis seborreica es una enfermedad cutnea que causa manchas rojas y escamosas. Suele aparecer en el cuero cabelludo, donde puede denominarse "caspa". Las manchas tambin pueden aparecer en otras partes del cuerpo. Las [employer] en la piel tienden a Research officer, trade union donde hay muchas glndulas sebceas. Las zonas del cuerpo que pueden verse afectadas incluyen las siguientes: El cuero cabelludo. La cara, las cejas y los odos. La zona alrededor [employer] Los pliegues de la piel. Estos [employer], la ingle y las nalgas. El pecho. La afeccin suele ser de larga duracin (crnica). Puede aparecer y desaparecer sin motivo conocido. Puede activarse por un factor desencadenante, por ejemplo: El clima fro. La exposicin al sol. Estrs. El consumo de alcohol. Cules son las causas? Se desconoce la causa de esta afeccin. Puede estar relacionada con un exceso de hongos en la piel o cambios en el funcionamiento del sistema que combate las enfermedades del cuerpo (sistema inmunitario). Qu incrementa el riesgo? Hay ms probabilidad de que tenga esta afeccin si: Tiene un sistema inmunitario dbil. Tiene 50 aos o ms. Tiene otras afecciones, como: Virus de inmunodeficiencia humana (VIH) o sndrome de inmunodeficiencia adquirida (sida). Enfermedad de Parkinson. Trastornos del Leonard de nimo, como depresin. Problemas hepticos. Obesidad. Cules son los signos o sntomas? Los sntomas de esta afeccin incluyen: Escamas gruesas en el cuero cabelludo. Enrojecimiento en el rostro o en las [place]. Piel escamosa. Las [employer] ser de color blanco o [place]. Piel que parece ser grasa o seca pero que no mejora con cremas hidratantes. Picazn o [employer] zonas afectadas. Cmo se diagnostica? Esta afeccin se diagnostica mediante una revisin de los antecedentes  mdicos y un examen fsico. Se pueden hacer estudios de una muestra de piel (biopsia de piel). Tal vez haya que consultar a un especialista en piel (dermatlogo). Cmo se trata? No hay cura para esta afeccin, pero el tratamiento puede ayudar a [employer] sntomas. Puede recibir tratamiento para [employer], reducir el riesgo de infecciones cutneas y reducir la hinchazn o la picazn. El tratamiento puede incluir: Champs con medicamentos, cremas humectantes o ungentos. Cremas para eliminar los hongos en la piel. Cremas para reducir la hinchazn y la irritacin (corticoesteroides). Siga estas instrucciones en su casa: Cuidado de la piel [place]plqu[place] champ con medicamentos, cremas o ungentos como se lo haya indicado el mdico. No use productos para la piel que contengan alcohol. Tome duchas o baos de inmersin de agua tibia. Evite el agua muy caliente. Cuando est al [place], use sombrero y vestimenta que bloquee la luz UV. Instrucciones generales Aplquese los medicamentos de venta libre y los recetados solamente como se lo haya indicado el mdico. Sepa cules son los desencadenantes que causan los sntomas para poder evitarlos. Recurra a tcnicas que reduzcan el estrs, como meditacin o yoga. No beba alcohol si el mdico se lo prohbe. Concurra a todas las visitas de seguimiento. El mdico le examinar la piel para asegurarse de que los tratamientos sean eficaces. Dnde obtener ms informacin Teacher, music of Dermatology (Academia Estadounidense de Dermatologa): MarketingSheets.si Comunquese con un mdico si: Los sntomas no mejoran con Scientist, research (medical). Sus sntomas empeoran. Aparecen nuevos sntomas. Solicite ayuda de inmediato si: Su afeccin empeora rpidamente, incluso con tratamiento. Esta informacin no tiene Theme park manager el consejo del mdico. Asegrese de hacerle al mdico cualquier pregunta que tenga. Document Revised: 01/15/2022 Document Reviewed:  01/15/2022 Elsevier  Patient Education  2023 Elsevier Inc.  

## 2022-11-25 NOTE — Progress Notes (Signed)
William Rose - 57 y.o. male MRN [id number]  Date of birth: [birth year]  Subjective Chief Complaint  Patient presents with   Foot Pain   Rash   Weight Loss   Testosterone    HPI William Rose is a 57 year old male here today for follow-up visit.  He is approximately 4 weeks out from Roux-en-Y gastric bypass.  He has done quite well with this.  His weight is down about 34 pounds.  He is following dietary recommendations.  Has been off on testosterone since his surgery.  He would like to know if he needs to continue this.  Reports that he feels pretty well without it.  He is having some pain along the Achilles of his right foot.  Seen by Dr. Benjamin Stain for this previously.  He does plan to schedule a follow-up with him.  He has scaly, flaky rash on his forehead.  Itchy at times.  Has not tried thing on it so far.  ROS:  A comprehensive ROS was completed and negative except as noted per HPI  No Known Allergies  Past Medical History:  Diagnosis Date   Headache    Sleep apnea     Past Surgical History:  Procedure Laterality Date   CARPAL TUNNEL RELEASE Bilateral    GASTRIC ROUX-EN-Y N/A 10/25/2022   Procedure: LAPAROSCOPIC ROUX-EN-Y GASTRIC BYPASS WITH UPPER ENDOSCOPY;  Surgeon: Gaynelle Adu, MD;  Location: [facility];  Service: General;  Laterality: N/A;    Social History   Socioeconomic History   Marital status: Married    Spouse name: Debby Bud   Number of children: 4   Years of education: Not on file   Highest education level: Associate degree: occupational, Scientist, product/process development, or vocational program  Occupational History    Employer: [employer]  Tobacco Use   Smoking status: Former    Packs/day: 1.00    Years: 30.00    Additional pack years: 0.00    Total pack years: 30.00    Types: Cigarettes    Quit date: 08/09/2017    Years since quitting: 5.3   Smokeless tobacco: Never  Vaping Use   Vaping Use: Never used  Substance and Sexual Activity   Alcohol use: Never    Drug use: Never   Sexual activity: Yes    Partners: Female  Other Topics Concern   Not on file  Social History Narrative   Lives with wife   R handed   Caffeine: use to drink 16 C of coffee a day and now 8 coffee a day   Social Determinants of Health   Financial Resource Strain: Low Risk  (11/22/2022)   Overall Financial Resource Strain (CARDIA)    Difficulty of Paying Living Expenses: Not very hard  Food Insecurity: Food Insecurity Present (11/22/2022)   Hunger Vital Sign    Worried About Running Out of Food in the Last Year: Sometimes true    Ran Out of Food in the Last Year: Sometimes true  Transportation Needs: No Transportation Needs (11/22/2022)   PRAPARE - Administrator, Civil Service (Medical): No    Lack of Transportation (Non-Medical): No  Physical Activity: Sufficiently Active (11/22/2022)   Exercise Vital Sign    Days of Exercise per Week: 4 days    Minutes of Exercise per Session: 120 min  Stress: Not on file  Social Connections: Moderately Isolated (11/22/2022)   Social Connection and Isolation Panel [NHANES]    Frequency of Communication with Friends and Family: More than three  times a week    Frequency of Social Gatherings with Friends and Family: Patient declined    Attends Religious Services: Never    Database administrator or Organizations: No    Attends Engineer, structural: Not on file    Marital Status: Married    Family History  Problem Relation Age of Onset   Epilepsy Mother     Health Maintenance  Topic Date Due   HIV Screening  Never done   Hepatitis C Screening  Never done   Lung Cancer Screening  Never done   Zoster Vaccines- Shingrix (1 of 2) 12/15/2022 (Originally 01/04/2017)   COVID-19 Vaccine (3 - 2023-24 season) 06/14/2023 (Originally 04/09/2022)   INFLUENZA VACCINE  03/10/2023   COLONOSCOPY (Pts 45-73yrs Insurance coverage will need to be confirmed)  08/09/2026   DTaP/Tdap/Td (2 - Td or Tdap) 10/11/2030   HPV VACCINES   Aged Out     ----------------------------------------------------------------------------------------------------------------------------------------------------------------------------------------------------------------- Physical Exam BP 120/80 (BP Location: Right Arm, Patient Position: Sitting, Cuff Size: Large)   Pulse 71   Ht  (1.702 m)   Wt 281 lb (127.5 kg)   SpO2 97%   BMI 44.01 kg/m   Physical Exam Constitutional:      Appearance: Normal appearance.  HENT:     Head: Normocephalic and atraumatic.  Eyes:     General: No scleral icterus. Cardiovascular:     Rate and Rhythm: Normal rate and regular rhythm.  Pulmonary:     Effort: Pulmonary effort is normal.     Breath sounds: Normal breath sounds.  Musculoskeletal:     Cervical back: Neck supple.  Neurological:     Mental Status: He is alert.  Psychiatric:        Mood and Affect: Mood normal.        Behavior: Behavior normal.     ------------------------------------------------------------------------------------------------------------------------------------------------------------------------------------------------------------------- Assessment and Plan  Achilles tendinitis He plans to schedule a follow-up with Dr. Benjamin Stain to readdress this.  S/P gastric bypass He is doing well since having weight loss surgery.  Encouraged to follow dietary changes and incorporation of new exercise patterns.  Seborrheic dermatitis Recommend adding selenium sulfide-containing shampoo initially.  He will let me know if not improving with this.  Low testosterone in male Will plan to recheck testosterone levels in a couple weeks.   No orders of the defined types were placed in this encounter.   Return in about 3 months (around 02/24/2023) for f/u Testosterone/Glucose.    This visit occurred during the SARS-CoV-2 public health emergency.  Safety protocols were in place, including screening questions prior to  the visit, additional usage of staff PPE, and extensive cleaning of exam room while observing appropriate contact time as indicated for disinfecting solutions.

## 2022-11-28 DIAGNOSIS — L219 Seborrheic dermatitis, unspecified: Secondary | ICD-10-CM | POA: Insufficient documentation

## 2022-11-28 NOTE — Assessment & Plan Note (Signed)
He is doing well since having weight loss surgery.  Encouraged to follow dietary changes and incorporation of new exercise patterns.

## 2022-11-28 NOTE — Assessment & Plan Note (Signed)
Recommend adding selenium sulfide-containing shampoo initially.  He will let me know if not improving with this.

## 2022-11-28 NOTE — Assessment & Plan Note (Signed)
Will plan to recheck testosterone levels in a couple weeks.

## 2022-11-28 NOTE — Assessment & Plan Note (Signed)
He plans to schedule a follow-up with Dr. Benjamin Stain to readdress this.

## 2022-11-30 ENCOUNTER — Ambulatory Visit: Payer: BC Managed Care – PPO | Admitting: Sports Medicine

## 2022-11-30 DIAGNOSIS — M7751 Other enthesopathy of right foot: Secondary | ICD-10-CM

## 2022-11-30 MED ORDER — TRIAZOLAM 0.25 MG PO TABS: ORAL_TABLET | ORAL | 0 refills | Status: DC

## 2022-11-30 NOTE — Assessment & Plan Note (Signed)
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 He will need boot immobilization for 1 week after the shot. Of note he is recently status post gastric bypass and we are unable to use NSAIDs for this reason.

## 2022-11-30 NOTE — Progress Notes (Signed)
    Procedures performed today:    None.  Independent interpretation of notes and tests performed by another provider:   None.  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 He will need boot immobilization for 1 week after the shot. Of note he is recently status post gastric bypass and we are unable to use NSAIDs for this reason.  I spent 30 minutes of total time managing this patient today, this includes chart review, face to face, and non-face to face time.  Interpreter was needed for this visit.  ____________________________________________ Ihor Austin. Benjamin Stain, M.D., ABFM., CAQSM., AME. Primary Care and Sports Medicine [place] [facility]  Adjunct Professor of Family Medicine  [employer] [facility] [employer]  Restaurant manager, fast food

## 2022-12-03 ENCOUNTER — Ambulatory Visit: Payer: BC Managed Care – PPO | Admitting: Sports Medicine

## 2022-12-03 ENCOUNTER — Other Ambulatory Visit (INDEPENDENT_AMBULATORY_CARE_PROVIDER_SITE_OTHER): Payer: BC Managed Care – PPO

## 2022-12-03 DIAGNOSIS — M7751 Other enthesopathy of right foot: Secondary | ICD-10-CM | POA: Diagnosis not present

## 2022-12-03 NOTE — Assessment & Plan Note (Signed)
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 He will need boot immobilization for 1 week after the shot. Of note he is recently status post gastric bypass and we are unable to use NSAIDs for this reason.  Update: Retrocalcaneal bursa injection done today with triazolam sedation. Return to see me in 6 weeks. He will wear a boot for a week.

## 2022-12-03 NOTE — Progress Notes (Signed)
    Procedures performed today:    Procedure: Real-time Ultrasound Guided injection of the right retrocalcaneal bursa Device: Samsung HS60  Verbal informed consent obtained.  Time-out conducted.  Noted no overlying erythema, induration, or other signs of local infection.  Skin prepped in a sterile fashion.  Local anesthesia: Topical Ethyl chloride.  With sterile technique and under real time ultrasound guidance: Noted inflamed bursa, 1 cc kenalog 40, 1 cc lidocaine injected easily completed without difficulty  Advised to call if fevers/chills, erythema, induration, drainage, or persistent bleeding.  Images permanently stored and available for review in PACS.  Impression: Technically successful ultrasound guided injection.  Independent interpretation of notes and tests performed by another provider:   None.  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 He will need boot immobilization for 1 week after the shot. Of note he is recently status post gastric bypass and we are unable to use NSAIDs for this reason.  Update: Retrocalcaneal bursa injection done today with triazolam sedation. Return to see me in 6 weeks. He will wear a boot for a week.    ____________________________________________ Ihor Austin. Benjamin Stain, M.D., ABFM., CAQSM., AME. Primary Care and Sports Medicine [place] [facility]  Adjunct Professor of Family Medicine  [place] of  [facility] [employer]  Restaurant manager, fast food

## 2023-01-14 ENCOUNTER — Ambulatory Visit: Payer: BC Managed Care – PPO | Admitting: Sports Medicine

## 2023-01-25 ENCOUNTER — Encounter: Payer: Self-pay | Admitting: Dietician

## 2023-01-25 ENCOUNTER — Encounter: Payer: BC Managed Care – PPO | Attending: General Surgery | Admitting: Dietician

## 2023-01-25 VITALS — Ht 67.0 in | Wt 237.1 lb

## 2023-01-25 DIAGNOSIS — E669 Obesity, unspecified: Secondary | ICD-10-CM | POA: Insufficient documentation

## 2023-01-25 NOTE — Progress Notes (Signed)
Bariatric Nutrition Follow-Up Visit Medical Nutrition Therapy  Appt Start Time: 11:20   End Time: 12:10  Surgery date: 10/25/2022 Surgery type: RYGB  Anthropometrics  Start weight at [facility]: 300.0 lbs (date: 06/01/2022) or 136.3 kg Height: 67 in Weight today: 237.1 lbs. (107.5 kg) BMI: 44.98 kg/m2     Clinical  Medical hx: Obesity, sleep apnea Medications: Vit D, semaglutide   Labs: A1C 5.7; HDL 36; LDL 106 Notable signs/symptoms: nothing noted Any previous deficiencies? No Bowel Habits: some constipation; using Miralax    Body Composition Scale 11/09/2022 01/25/2023  Current Body Weight 287.2 237.1  Total Body Fat % 39.1 32.5  Visceral Fat 36 25  Fat-Free Mass % 60.8 67.4   Total Body Water % 41.8 48.4  Muscle-Mass lbs 54.2 44.6  BMI 46.3 36.8  Body Fat Displacement           Torso  lbs 69.8 47.8         Left Leg  lbs 13.9 9.5         Right Leg  lbs 13.9 9.5         Left Arm  lbs 6.9 4.7         Right Arm  lbs 6.9 4.7    Lifestyle & Dietary Hx  Pt states he is cleared to go the gym, stating he is going 3 days a week, 2 hours each visit. Pt states he only walks.  Pt states he is still drinking 2-3 protein shakes a day, stating he is not eating much, stating he can't, and gets nauseous. Pt states he can tolerate watermelon, laughing cow cheese, 2-3 olives and that is it. Pt states that meat is tough to eat, stating he is supplementing with protein shakes. Pt states he eats two prunes to help with constipation.  Estimated daily fluid intake: 2-3 liters (64-96 oz) Estimated daily protein intake: 60-90 g Supplements: multivitamin and calcium Current average weekly physical activity: walking at the gym 3 days a week for 2 hours   24-Hr Dietary Recall First Meal: protein shake (7 am) and half cheese, water, half yogurt Snack: yogurt and jello  Second Meal: meat, watermelon, cheese, watermelon Snack:   Third Meal: water, sf ice cream, jello, cheese Snack: pouched egg, two  prunes Beverages: tea (matte),   Post-Op Goals/ Signs/ Symptoms Using straws: no Drinking while eating: no Chewing/swallowing difficulties: no Changes in vision: no Changes to mood/headaches: no Hair loss/changes to skin/nails: no Difficulty focusing/concentrating: no Sweating: no Limb weakness: no Dizziness/lightheadedness: no Palpitations: no  Carbonated/caffeinated beverages: no N/V/D/C/Gas: some constipation Abdominal pain: no Dumping syndrome: no    NUTRITION DIAGNOSIS  Overweight/obesity (Myrtle-3.3) related to past poor dietary habits and physical inactivity as evidenced by completed bariatric surgery and following dietary guidelines for continued weight loss and healthy nutrition status.     NUTRITION INTERVENTION Nutrition counseling (C-1) and education (E-2) to facilitate bariatric surgery goals, including: Diet advancement to the Standard Prep plan The importance of consuming adequate calories as well as certain nutrients daily due to the body's need for essential vitamins, minerals, and fats The importance of daily physical activity and to reach a goal of at least 150 minutes of moderate to vigorous physical activity weekly (or as directed by their physician) due to benefits such as increased musculature and improved lab values The importance of intuitive eating specifically learning hunger-satiety cues and understanding the importance of learning a new body: The importance of mindful eating to avoid grazing behaviors  Encouraged patient  to honor their body's internal hunger and fullness cues.  Throughout the day, check in mentally and rate hunger. Stop eating when satisfied not full regardless of how much food is left on the plate.  Get more if still hungry 20-30 minutes later.  The key is to honor satisfaction so throughout the meal, rate fullness factor and stop when comfortably satisfied not physically full. The key is to honor hunger and fullness without any feelings of  guilt or shame.  Pay attention to what the internal cues are, rather than any external factors. This will enhance the confidence you have in listening to your own body and following those internal cues enabling you to increase how often you eat when you are hungry not out of appetite and stop when you are satisfied not full.  Encouraged pt to continue to eat balanced meals inclusive of non starchy vegetables 2 times a day 7 days a week Encouraged pt to choose lean protein sources: limiting beef, pork, sausage, hotdogs, and lunch meat Encourage pt to choose healthy fats such as plant based limiting animal fats Encouraged pt to continue to drink a minium 64 fluid ounces with half being plain water to satisfy proper hydration    Goals Eat your protein first Increase non-starchy vegetables; aim for at least two servings per day Choose complex carbohydrates (whole grains and whole fruits) Resistance exercises to minimize muscle loss  Handouts Provided Include  Standard Prep Plan Advancement Guide (Spanish)  Learning Style & Readiness for Change Teaching method utilized: Visual & Auditory  Demonstrated degree of understanding via: Teach Back  Readiness Level: Ready Barriers to learning/adherence to lifestyle change: nothing identified  RD's Notes for Next Visit Assess adherence to pt chosen goals  MONITORING & EVALUATION Dietary intake, weekly physical activity, body weight.  Next Steps Patient is to follow-up in 3 months for 6 month post-op follow-up.

## 2023-02-24 ENCOUNTER — Encounter: Payer: Self-pay | Admitting: Family Medicine

## 2023-02-24 ENCOUNTER — Ambulatory Visit (INDEPENDENT_AMBULATORY_CARE_PROVIDER_SITE_OTHER): Payer: BC Managed Care – PPO

## 2023-02-24 ENCOUNTER — Ambulatory Visit: Payer: BC Managed Care – PPO | Admitting: Family Medicine

## 2023-02-24 VITALS — BP 119/73 | HR 68 | Ht 67.0 in | Wt 226.0 lb

## 2023-02-24 DIAGNOSIS — R7303 Prediabetes: Secondary | ICD-10-CM

## 2023-02-24 DIAGNOSIS — R29898 Other symptoms and signs involving the musculoskeletal system: Secondary | ICD-10-CM

## 2023-02-24 DIAGNOSIS — R7989 Other specified abnormal findings of blood chemistry: Secondary | ICD-10-CM | POA: Diagnosis not present

## 2023-02-24 DIAGNOSIS — E291 Testicular hypofunction: Secondary | ICD-10-CM | POA: Diagnosis not present

## 2023-02-24 NOTE — Assessment & Plan Note (Signed)
Update testosterone levels.  

## 2023-02-24 NOTE — Progress Notes (Signed)
William Rose - 57 y.o. male MRN [id number]  Date of birth: Jul 08, 1966  Subjective Chief Complaint  Patient presents with   Hypogonadism    HPI William Rose is a 57 year old male here today for follow-up visit.  It has been about 4 months since his gastric bypass.  He has done quite well since having this done.  He has lost over 70 pounds.  Blood pressure is well-controlled.  He is off all prescribed medications at this time.  He would like to have blood sugars as well as testosterone rechecked.  Since losing weight he has noted a prominence over the xiphoid.  Area is tender if he pushes on it.  ROS:  A comprehensive ROS was completed and negative except as noted per HPI  No Known Allergies  Past Medical History:  Diagnosis Date   Headache    Sleep apnea     Past Surgical History:  Procedure Laterality Date   CARPAL TUNNEL RELEASE Bilateral    GASTRIC ROUX-EN-Y N/A 10/25/2022   Procedure: LAPAROSCOPIC ROUX-EN-Y GASTRIC BYPASS WITH UPPER ENDOSCOPY;  Surgeon: Gaynelle Adu, MD;  Location: [facility];  Service: General;  Laterality: N/A;    Social History   Socioeconomic History   Marital status: Married    Spouse name: Debby Bud   Number of children: 4   Years of education: Not on file   Highest education level: Associate degree: occupational, Scientist, product/process development, or vocational program  Occupational History    Employer: [employer]  Tobacco Use   Smoking status: Former    Current packs/day: 0.00    Average packs/day: 1 pack/day for 30.0 years (30.0 ttl pk-yrs)    Types: Cigarettes    Start date: 08/10/1987    Quit date: 08/09/2017    Years since quitting: 5.5   Smokeless tobacco: Never  Vaping Use   Vaping status: Never Used  Substance and Sexual Activity   Alcohol use: Never   Drug use: Never   Sexual activity: Yes    Partners: Female  Other Topics Concern   Not on file  Social History Narrative   Lives with wife   R handed   Caffeine: use to drink 16 C of coffee a day and now 8  coffee a day   Social Determinants of Health   Financial Resource Strain: Low Risk  (11/22/2022)   Overall Financial Resource Strain (CARDIA)    Difficulty of Paying Living Expenses: Not very hard  Food Insecurity: Food Insecurity Present (11/22/2022)   Hunger Vital Sign    Worried About Running Out of Food in the Last Year: Sometimes true    Ran Out of Food in the Last Year: Sometimes true  Transportation Needs: No Transportation Needs (11/22/2022)   PRAPARE - Administrator, Civil Service (Medical): No    Lack of Transportation (Non-Medical): No  Physical Activity: Sufficiently Active (11/22/2022)   Exercise Vital Sign    Days of Exercise per Week: 4 days    Minutes of Exercise per Session: 120 min  Stress: Not on file  Social Connections: Moderately Isolated (11/22/2022)   Social Connection and Isolation Panel [NHANES]    Frequency of Communication with Friends and Family: More than three times a week    Frequency of Social Gatherings with Friends and Family: Patient declined    Attends Religious Services: Never    Database administrator or Organizations: No    Attends Engineer, structural: Not on file    Marital Status: Married  Family History  Problem Relation Age of Onset   Epilepsy Mother     Health Maintenance  Topic Date Due   Lung Cancer Screening  Never done   Zoster Vaccines- Shingrix (1 of 2) 05/27/2023 (Originally 01/04/2017)   COVID-19 Vaccine (3 - 2023-24 season) 06/14/2023 (Originally 04/09/2022)   Hepatitis C Screening  02/24/2024 (Originally 01/04/1985)   HIV Screening  02/24/2024 (Originally 01/04/1982)   INFLUENZA VACCINE  03/10/2023   Colonoscopy  08/09/2026   DTaP/Tdap/Td (2 - Td or Tdap) 10/11/2030   HPV VACCINES  Aged Out      ----------------------------------------------------------------------------------------------------------------------------------------------------------------------------------------------------------------- Physical Exam BP 119/73 (BP Location: Right Arm, Patient Position: Sitting, Cuff Size: Normal)   Pulse 68   Ht 5\' 7"  (1.702 m)   Wt 226 lb (102.5 kg)   SpO2 98%   BMI 35.40 kg/m   Physical Exam Constitutional:      Appearance: Normal appearance.  HENT:     Head: Normocephalic and atraumatic.  Cardiovascular:     Rate and Rhythm: Normal rate and regular rhythm.  Pulmonary:     Effort: Pulmonary effort is normal.     Breath sounds: Normal breath sounds.     Comments: Xiphoid prominence noted. Neurological:     Mental Status: He is alert.  Psychiatric:        Mood and Affect: Mood normal.        Behavior: Behavior normal.     ------------------------------------------------------------------------------------------------------------------------------------------------------------------------------------------------------------------- Assessment and Plan  Prediabetes Update A1c.  Low testosterone in male Update testosterone levels.  Xiphoid prominence It is likely that he has had this for quite some time however has become more noticeable with his drastic weight loss.  High likelihood this is benign.  I have ordered a chest x-ray as well for further evaluation.   No orders of the defined types were placed in this encounter.   Return in about 6 months (around 08/27/2023) for testosterone.    This visit occurred during the SARS-CoV-2 public health emergency.  Safety protocols were in place, including screening questions prior to the visit, additional usage of staff PPE, and extensive cleaning of exam room while observing appropriate contact time as indicated for disinfecting solutions.

## 2023-02-24 NOTE — Assessment & Plan Note (Signed)
Update A1c ?

## 2023-02-24 NOTE — Assessment & Plan Note (Signed)
It is likely that he has had this for quite some time however has become more noticeable with his drastic weight loss.  High likelihood this is benign.  I have ordered a chest x-ray as well for further evaluation.

## 2023-02-25 LAB — CBC WITH DIFFERENTIAL/PLATELET
Absolute Monocytes: 488 cells/uL (ref 200–950)
Basophils Absolute: 28 cells/uL (ref 0–200)
Basophils Relative: 0.3 %
Eosinophils Absolute: 184 cells/uL (ref 15–500)
Eosinophils Relative: 2 %
HCT: 40.7 % (ref 38.5–50.0)
Hemoglobin: 13.3 g/dL (ref 13.2–17.1)
Lymphs Abs: 3606 cells/uL (ref 850–3900)
MCH: 29.4 pg (ref 27.0–33.0)
MCHC: 32.7 g/dL (ref 32.0–36.0)
MCV: 90 fL (ref 80.0–100.0)
MPV: 11.5 fL (ref 7.5–12.5)
Monocytes Relative: 5.3 %
Neutro Abs: 4894 cells/uL (ref 1500–7800)
Neutrophils Relative %: 53.2 %
Platelets: 291 10*3/uL (ref 140–400)
RBC: 4.52 10*6/uL (ref 4.20–5.80)
RDW: 13.4 % (ref 11.0–15.0)
Total Lymphocyte: 39.2 %
WBC: 9.2 10*3/uL (ref 3.8–10.8)

## 2023-02-25 LAB — TESTOSTERONE: Testosterone: 144 ng/dL — ABNORMAL LOW (ref 250–827)

## 2023-02-25 LAB — BASIC METABOLIC PANEL
BUN/Creatinine Ratio: 40 (calc) — ABNORMAL HIGH (ref 6–22)
BUN: 27 mg/dL — ABNORMAL HIGH (ref 7–25)
CO2: 30 mmol/L (ref 20–32)
Calcium: 9.4 mg/dL (ref 8.6–10.3)
Chloride: 105 mmol/L (ref 98–110)
Creat: 0.67 mg/dL — ABNORMAL LOW (ref 0.70–1.30)
Glucose, Bld: 97 mg/dL (ref 65–99)
Potassium: 4.3 mmol/L (ref 3.5–5.3)
Sodium: 141 mmol/L (ref 135–146)

## 2023-02-25 LAB — HEMOGLOBIN A1C
Hgb A1c MFr Bld: 5.4 % of total Hgb (ref <5.7)
Mean Plasma Glucose: 108 mg/dL
eAG (mmol/L): 6 mmol/L

## 2023-03-14 ENCOUNTER — Encounter: Payer: Self-pay | Admitting: Family Medicine

## 2023-03-18 ENCOUNTER — Ambulatory Visit (INDEPENDENT_AMBULATORY_CARE_PROVIDER_SITE_OTHER): Payer: BC Managed Care – PPO | Admitting: Medical-Surgical

## 2023-03-18 VITALS — BP 106/69 | HR 68 | Ht 67.0 in | Wt 221.0 lb

## 2023-03-18 DIAGNOSIS — E291 Testicular hypofunction: Secondary | ICD-10-CM | POA: Diagnosis not present

## 2023-03-18 MED ORDER — TESTOSTERONE CYPIONATE 200 MG/ML IM SOLN
200.0000 mg | INTRAMUSCULAR | Status: AC
Start: 2023-03-18 — End: 2023-06-24
  Administered 2023-03-18 – 2023-04-15 (×2): 200 mg via INTRAMUSCULAR

## 2023-03-18 NOTE — Progress Notes (Signed)
Medical screening examination/treatment was performed by qualified clinical staff member and as supervising provider I was immediately available for consultation/collaboration. I have reviewed documentation and agree with assessment and plan. ° °Joy L. Jessup, DNP, APRN, FNP-BC °[facility] °Primary Care and Sports Medicine ° °

## 2023-03-18 NOTE — Progress Notes (Signed)
Patient is here for a testosterone injection of 200mg/ml.  Given in RUOQ.  Denies chest pain, shortness of breath, headaches and problems with medication or mood changes.  Tolerated injection well without complications.   Patient advised to schedule next injection in 14 days.  

## 2023-04-01 ENCOUNTER — Ambulatory Visit (INDEPENDENT_AMBULATORY_CARE_PROVIDER_SITE_OTHER): Payer: BC Managed Care – PPO | Admitting: Family Medicine

## 2023-04-01 ENCOUNTER — Encounter: Payer: Self-pay | Admitting: *Deleted

## 2023-04-01 VITALS — BP 117/82 | HR 69 | Ht 67.0 in | Wt 220.0 lb

## 2023-04-01 DIAGNOSIS — E291 Testicular hypofunction: Secondary | ICD-10-CM

## 2023-04-01 MED ORDER — TESTOSTERONE CYPIONATE 200 MG/ML IM SOLN
200.0000 mg | Freq: Once | INTRAMUSCULAR | Status: AC
Start: 2023-04-01 — End: 2023-04-01
  Administered 2023-04-01: 200 mg via INTRAMUSCULAR

## 2023-04-01 NOTE — Progress Notes (Signed)
Pt here for testosterone injection no SOB,CP or mood swings.   Injection given in LUOQ he tolerated well.   Pt will RTC in 2 weeks for next injection.

## 2023-04-15 ENCOUNTER — Ambulatory Visit (INDEPENDENT_AMBULATORY_CARE_PROVIDER_SITE_OTHER): Payer: BC Managed Care – PPO

## 2023-04-15 VITALS — BP 114/79 | HR 62

## 2023-04-15 DIAGNOSIS — E291 Testicular hypofunction: Secondary | ICD-10-CM

## 2023-04-15 DIAGNOSIS — R7989 Other specified abnormal findings of blood chemistry: Secondary | ICD-10-CM

## 2023-04-15 NOTE — Progress Notes (Signed)
   Established Patient Office Visit  Subjective   Patient ID: William Rose, male    DOB: 1966/08/09  Age: 57 y.o. MRN: [id number]  Chief Complaint  Patient presents with   Hypogonadism    HPI  Evan Osen Gerstner is here for a testosterone injection. Denies chest pain, shortness of breath, headaches or mood changes.   ROS    Objective:     BP 114/79   Pulse 62   SpO2 100%    Physical Exam   No results found for any visits on 04/15/23.    The 10-year ASCVD risk score (Arnett DK, et al., 2019) is: 5.3%    Assessment & Plan:  Testosterone injection - Patient tolerated injection well without complications. Patient advised to schedule next injection 14 days from today.    Problem List Items Addressed This Visit       Unprioritized   Low testosterone in male - Primary    Return in about 2 weeks (around 04/29/2023) for testosterone injection. Earna Coder, Janalyn Harder, CMA

## 2023-04-18 NOTE — Progress Notes (Unsigned)
Patient: William Rose Date of Birth: 08/17/1965  Reason for Visit: Follow up initial CPAP History from: Patient, interpreter Primary Neurologist: Dr. Vickey Huger  ASSESSMENT AND PLAN 57 y.o. year old male   1.  Moderate severe sleep apnea on CPAP -Download shows overall excellent he is encouraged to continue nightly use greater than 4 hours -May try increasing the humidity to create a tighter seal on the mask, if still feels leaking may need mask refit, ordered continued supplies -Is considering gastric bypass in the future  -Return here in 1 year or sooner if needed  HISTORY OF PRESENT ILLNESS: Today 04/18/23   04/15/22 SS: William Rose is here today for initial CPAP visit.  Download below looks excellent. There is some leak with the mask at 23.1. doing well with CPAP. Using full face mask. Sometimes feels leak, it will wake him up, will push it in, it's ok. No more snoring. Feels more rested. Mouth isn't dry anymore. Is still on Ozempic, waiting for gastric bypass. Feels enough pressure from CPAP. Feels the silicone on the mask isn't as sticky. ESS 13  IMPRESSION:  This HST confirms the presence of moderate severe sleep apnea which is strongly REM sleep dependent, associated with a moderate severity of hypoxia.  During REM sleep all hypoxic events seem to have clustered, down to a nadir of 70% saturation.  Based on this the patient is in need of positive airway pressure therapy as an inspire device or dental device cannot address hypoxia and REM dependent apnea.     HISTORY  Copied Dr. Vickey Huger 02/01/22: William Rose is a 58 y.o. year old Spaniard seen here as a referral on 02/01/2022 from PCP.  Chief concern according to patient :  Rm 1, w interpreter. Pt was  referred by Everrett Coombe, DO. for loud snoring, and witnessed sleep apnea by spouse. His HST was performed on 01-20-2022: Moderate severe OSA, worse on the right lateral side sleep position, and he loves to  sleep prone. His snoring reached 52 dB. His BMI is still 49 on 3 months of Ozempic. He is curious about treatment and wanted to widen his understanding of apnea.  CPAP or BiPAP. He is a little scared, he stated through his interpreter. He was advised of compliance need for insurance to cover the costs. He needs an interface to allow prone sleep.   Last SS over 5 years ago, normal workup-he was a smoker at that time. He had the study while in [place].   Pt noticed no change in symptoms since then, but gained weight since he quit smoking .  Wakes up with dry mouth some nights. Average 6 hrs of sleep.  Use to drink 16 small C of coffee now drinks about 8 C.    Emrick Spires Altizer  has no past medical history on file.     Sleep relevant medical history: Nocturia once, low spine surgery.   Family medical /sleep history: No other family member on CPAP with OSA.    Social history:  Patient is working in [employer],  Special educational needs teacher, and and lives in a household with spouse,  4 children are grown.  The patient currently works/ used to work in shifts( rotating,) 4 early and 4 late.  Tobacco use- quit 4 years ago.  ETOH use : quit ,  Caffeine intake in form of Coffee( 8 cups a day). Regular exercise: none .     Sleep habits are as follows: The patient's  dinner time is between 8 PM. The patient goes to bed at 9 PM and continues to sleep for 5-6 hours, wakes for one bathroom breaks. The preferred sleep position is prone, with the support of 2 pillows.  Dreams are reportedly frequent/vivid.  3.30 for early shift 3.30  AM is the usual rise time.  The patient wakes up spontaneously before an alarm.  He reports not feeling refreshed or restored in AM, with symptoms such as dry mouth, and residual fatigue. Naps are taken infrequently.   REVIEW OF SYSTEMS: Out of a complete 14 system review of symptoms, the patient complains only of the following symptoms, and all other reviewed systems are  negative.  See HPI  ALLERGIES: No Known Allergies  HOME MEDICATIONS: No outpatient medications prior to visit.   Facility-Administered Medications Prior to Visit  Medication Dose Route Frequency Provider Last Rate Last Admin   testosterone cypionate (DEPOTESTOSTERONE CYPIONATE) injection 200 mg  200 mg Intramuscular Q14 Days Everrett Coombe, DO   200 mg at 04/15/23 1050    PAST MEDICAL HISTORY: Past Medical History:  Diagnosis Date   Headache    Sleep apnea     PAST SURGICAL HISTORY: Past Surgical History:  Procedure Laterality Date   CARPAL TUNNEL RELEASE Bilateral    GASTRIC ROUX-EN-Y N/A 10/25/2022   Procedure: LAPAROSCOPIC ROUX-EN-Y GASTRIC BYPASS WITH UPPER ENDOSCOPY;  Surgeon: Gaynelle Adu, MD;  Location: [facility];  Service: General;  Laterality: N/A;    FAMILY HISTORY: Family History  Problem Relation Age of Onset   Epilepsy Mother     SOCIAL HISTORY: Social History   Socioeconomic History   Marital status: Married    Spouse name: Debby Bud   Number of children: 4   Years of education: Not on file   Highest education level: Associate degree: occupational, Scientist, product/process development, or vocational program  Occupational History    Employer: [employer]  Tobacco Use   Smoking status: Former    Current packs/day: 0.00    Average packs/day: 1 pack/day for 30.0 years (30.0 ttl pk-yrs)    Types: Cigarettes    Start date: 08/10/1987    Quit date: 08/09/2017    Years since quitting: 5.6   Smokeless tobacco: Never  Vaping Use   Vaping status: Never Used  Substance and Sexual Activity   Alcohol use: Never   Drug use: Never   Sexual activity: Yes    Partners: Female  Other Topics Concern   Not on file  Social History Narrative   Lives with wife   R handed   Caffeine: use to drink 16 C of coffee a day and now 8 coffee a day   Social Determinants of Health   Financial Resource Strain: Low Risk  (11/22/2022)   Overall Financial Resource Strain (CARDIA)    Difficulty of Paying Living  Expenses: Not very hard  Food Insecurity: Food Insecurity Present (11/22/2022)   Hunger Vital Sign    Worried About Running Out of Food in the Last Year: Sometimes true    Ran Out of Food in the Last Year: Sometimes true  Transportation Needs: No Transportation Needs (11/22/2022)   PRAPARE - Administrator, Civil Service (Medical): No    Lack of Transportation (Non-Medical): No  Physical Activity: Sufficiently Active (11/22/2022)   Exercise Vital Sign    Days of Exercise per Week: 4 days    Minutes of Exercise per Session: 120 min  Stress: Not on file  Social Connections: Moderately Isolated (11/22/2022)  Social Advertising account executive [NHANES]    Frequency of Communication with Friends and Family: More than three times a week    Frequency of Social Gatherings with Friends and Family: Patient declined    Attends Religious Services: Never    Database administrator or Organizations: No    Attends Engineer, structural: Not on file    Marital Status: Married  Catering manager Violence: Not At Risk (10/25/2022)   Humiliation, Afraid, Rape, and Kick questionnaire    Fear of Current or Ex-Partner: No    Emotionally Abused: No    Physically Abused: No    Sexually Abused: No    PHYSICAL EXAM  There were no vitals filed for this visit.  There is no height or weight on file to calculate BMI.  Generalized: Well developed, in no acute distress  Neurological examination  Mentation: Alert oriented to time, place, history taking. Follows all commands speech and language fluent Cranial nerve II-XII: Pupils were equal round reactive to light. Extraocular movements were full, visual field were full on confrontational test. Facial sensation and strength were normal.  Head turning and shoulder shrug  were normal and symmetric. Motor: The motor testing reveals 5 over 5 strength of all 4 extremities. Good symmetric motor tone is noted throughout.  Sensory: Sensory testing is  intact to soft touch on all 4 extremities. No evidence of extinction is noted.  Coordination: Cerebellar testing reveals good finger-nose-finger and heel-to-shin bilaterally.  Gait and station: Gait is normal, wide with large body habitus  DIAGNOSTIC DATA (LABS, IMAGING, TESTING) - I reviewed patient records, labs, notes, testing and imaging myself where available.  Lab Results  Component Value Date   WBC 9.2 02/24/2023   HGB 13.3 02/24/2023   HCT 40.7 02/24/2023   MCV 90.0 02/24/2023   PLT 291 02/24/2023      Component Value Date/Time   NA 141 02/24/2023 1602   NA 138 02/12/2021 0000   K 4.3 02/24/2023 1602   CL 105 02/24/2023 1602   CO2 30 02/24/2023 1602   GLUCOSE 97 02/24/2023 1602   BUN 27 (H) 02/24/2023 1602   BUN 16 02/12/2021 0000   CREATININE 0.67 (L) 02/24/2023 1602   CALCIUM 9.4 02/24/2023 1602   PROT 7.2 10/26/2022 0501   ALBUMIN 4.2 10/26/2022 0501   AST 29 10/26/2022 0501   ALT 37 10/26/2022 0501   ALKPHOS 41 10/26/2022 0501   BILITOT 0.8 10/26/2022 0501   GFRNONAA >60 10/26/2022 0501   GFRNONAA 105 09/26/2020 0927   GFRAA 119 02/12/2021 0000   GFRAA 121 09/26/2020 0927   Lab Results  Component Value Date   CHOL 156 02/12/2021   HDL 39 02/12/2021   LDLCALC 91 02/12/2021   TRIG 130 02/12/2021   CHOLHDL 4.3 09/26/2020   Lab Results  Component Value Date   HGBA1C 5.4 02/24/2023   No results found for: "VITAMINB12" Lab Results  Component Value Date   TSH 1.19 03/27/2021    Margie Ege, AGNP-C, DNP 04/18/2023, 9:39 PM [facility] [address], [address] [place], [place] [address] [phone]

## 2023-04-19 ENCOUNTER — Encounter: Payer: Self-pay | Admitting: Neurology

## 2023-04-19 ENCOUNTER — Ambulatory Visit: Payer: Self-pay | Admitting: Neurology

## 2023-04-19 VITALS — BP 116/72 | Ht 66.0 in | Wt 221.0 lb

## 2023-04-19 DIAGNOSIS — G4733 Obstructive sleep apnea (adult) (pediatric): Secondary | ICD-10-CM

## 2023-04-19 DIAGNOSIS — G478 Other sleep disorders: Secondary | ICD-10-CM

## 2023-04-19 DIAGNOSIS — G4734 Idiopathic sleep related nonobstructive alveolar hypoventilation: Secondary | ICD-10-CM

## 2023-04-19 NOTE — Patient Instructions (Signed)
I will order repeat home sleep study.  For now, please continue CPAP usage.  I will update you once the sleep study results.

## 2023-04-25 ENCOUNTER — Telehealth: Payer: Self-pay | Admitting: Neurology

## 2023-04-25 NOTE — Telephone Encounter (Signed)
Sent mychart message to the patient.

## 2023-04-28 ENCOUNTER — Encounter: Payer: BC Managed Care – PPO | Attending: General Surgery | Admitting: Dietician

## 2023-04-28 ENCOUNTER — Encounter: Payer: Self-pay | Admitting: Dietician

## 2023-04-28 VITALS — Ht 67.0 in | Wt 220.0 lb

## 2023-04-28 DIAGNOSIS — Z713 Dietary counseling and surveillance: Secondary | ICD-10-CM | POA: Diagnosis not present

## 2023-04-28 DIAGNOSIS — G4730 Sleep apnea, unspecified: Secondary | ICD-10-CM | POA: Insufficient documentation

## 2023-04-28 DIAGNOSIS — E669 Obesity, unspecified: Secondary | ICD-10-CM | POA: Diagnosis not present

## 2023-04-28 NOTE — Progress Notes (Addendum)
Bariatric Nutrition Follow-Up Visit Medical Nutrition Therapy  Appt Start Time: 2:50   End Time: 3:50  [place] in person Spanish interpretor was used for this visit.  Surgery date: 10/25/2022 Surgery type: RYGB  Anthropometrics  Start weight at [facility]: 300.0 lbs (date: 06/01/2022) or 136.3 kg Height: 67 in Weight today: 220.0 lbs. (99.6 kg)   Clinical  Medical hx: Obesity, sleep apnea Medications: Vit D, semaglutide   Labs: A1C 5.7; HDL 36; LDL 106 Notable signs/symptoms: nothing noted Any previous deficiencies? No Bowel Habits: some constipation; using Miralax    Body Composition Scale 11/09/2022 01/25/2023 04/28/2023  Current Body Weight 287.2 237.1 220.0  Total Body Fat % 39.1 32.5 30.0  Visceral Fat 36 25 21  Fat-Free Mass % 60.8 67.4 69.9   Total Body Water % 41.8 48.4 50.9  Muscle-Mass lbs 54.2 44.6 41.3  BMI 46.3 36.8 34.1  Body Fat Displacement            Torso  lbs 69.8 47.8 40.8         Left Leg  lbs 13.9 9.5 8.1         Right Leg  lbs 13.9 9.5 8.1         Left Arm  lbs 6.9 4.7 4.0         Right Arm  lbs 6.9 4.7 4.0    Lifestyle & Dietary Hx  Pt states he eats berries to help with bowel movements. Pt states he is doing well separating his fluids from his meals and snacks. Pt states he does not have set break times at work. Pt states he is still drinking 2-3 protein shakes a day, stating he is not eating much, stating he can't, and gets nauseous. Pt states he can tolerate watermelon, laughing cow cheese, 2-3 olives and that is it. Pt states that meat is tough to eat, stating he is supplementing with protein shakes.  Estimated daily fluid intake: 2-3 liters (64-96 oz) Estimated daily protein intake: 60-90 g Supplements: multivitamin and calcium Current average weekly physical activity: walking daily  24-Hr Dietary Recall First Meal: cheese and yogurt Snack: zero sugar jello and baked multi grain crackers or berries Second Meal: pasta with tomato based  sauce Snack:   Third Meal: rice cakes with cheese Snack: pouched egg, two prunes or cheese or multi-grain Beverages: tea (matte), water, vitamin water  Post-Op Goals/ Signs/ Symptoms Using straws: no Drinking while eating: no Chewing/swallowing difficulties: no Changes in vision: no Changes to mood/headaches: no Hair loss/changes to skin/nails: no Difficulty focusing/concentrating: no Sweating: no Limb weakness: no Dizziness/lightheadedness: no Palpitations: no  Carbonated/caffeinated beverages: no N/V/D/C/Gas: some constipation Abdominal pain: no Dumping syndrome: no    NUTRITION DIAGNOSIS  Overweight/obesity (-3.3) related to past poor dietary habits and physical inactivity as evidenced by completed bariatric surgery and following dietary guidelines for continued weight loss and healthy nutrition status.     NUTRITION INTERVENTION Nutrition counseling (C-1) and education (E-2) to facilitate bariatric surgery goals, including: Diet advancement to the Standard Prep plan The importance of consuming adequate calories as well as certain nutrients daily due to the body's need for essential vitamins, minerals, and fats The importance of daily physical activity and to reach a goal of at least 150 minutes of moderate to vigorous physical activity weekly (or as directed by their physician) due to benefits such as increased musculature and improved lab values Emphasized resistance exercises to reduce muscle mass loss. The importance of intuitive eating specifically learning hunger-satiety cues  and understanding the importance of learning a new body: The importance of mindful eating to avoid grazing behaviors  Encouraged patient to honor their body's internal hunger and fullness cues.  Throughout the day, check in mentally and rate hunger. Stop eating when satisfied not full regardless of how much food is left on the plate.  Get more if still hungry 20-30 minutes later.  The key is to  honor satisfaction so throughout the meal, rate fullness factor and stop when comfortably satisfied not physically full. The key is to honor hunger and fullness without any feelings of guilt or shame.  Pay attention to what the internal cues are, rather than any external factors. This will enhance the confidence you have in listening to your own body and following those internal cues enabling you to increase how often you eat when you are hungry not out of appetite and stop when you are satisfied not full.  Encouraged pt to continue to eat balanced meals inclusive of non starchy vegetables 2 times a day 7 days a week Encouraged pt to choose lean protein sources: limiting beef, pork, sausage, hotdogs, and lunch meat Encourage pt to choose healthy fats such as plant based limiting animal fats Encouraged pt to continue to drink a minium 64 fluid ounces with half being plain water to satisfy proper hydration    Goals Eat your protein first Increase non-starchy vegetables; aim for at least two servings per day Choose complex carbohydrates (whole grains and whole fruits) Re-engage: Resistance exercises to minimize muscle loss. Contact the BELT program.  Handouts Provided Include  Maintenance Healthy Eating Guide, 6 months (Spanish)  Learning Style & Readiness for Change Teaching method utilized: Visual & Auditory  Demonstrated degree of understanding via: Teach Back  Readiness Level: Ready Barriers to learning/adherence to lifestyle change: nothing identified  RD's Notes for Next Visit Assess adherence to pt chosen goals  MONITORING & EVALUATION Dietary intake, weekly physical activity, body weight.  Next Steps Patient is to follow-up in 3 months for 9 month post-op follow-up.

## 2023-04-29 ENCOUNTER — Ambulatory Visit: Payer: BC Managed Care – PPO

## 2023-05-06 ENCOUNTER — Ambulatory Visit (INDEPENDENT_AMBULATORY_CARE_PROVIDER_SITE_OTHER): Payer: BC Managed Care – PPO

## 2023-05-06 DIAGNOSIS — R7989 Other specified abnormal findings of blood chemistry: Secondary | ICD-10-CM | POA: Diagnosis not present

## 2023-05-06 DIAGNOSIS — E291 Testicular hypofunction: Secondary | ICD-10-CM | POA: Diagnosis not present

## 2023-05-06 MED ORDER — CYANOCOBALAMIN 1000 MCG/ML IJ SOLN
200.0000 ug | Freq: Once | INTRAMUSCULAR | Status: DC
Start: 2023-05-06 — End: 2023-05-06

## 2023-05-06 MED ORDER — TESTOSTERONE CYPIONATE 200 MG/ML IM SOLN
200.0000 mg | Freq: Once | INTRAMUSCULAR | Status: AC
Start: 2023-05-06 — End: 2023-05-06
  Administered 2023-05-06: 200 mg via INTRAMUSCULAR

## 2023-05-06 NOTE — Addendum Note (Signed)
Addended by: Carren Rang A on: 05/06/2023 01:56 PM   Modules accepted: Orders

## 2023-05-06 NOTE — Progress Notes (Signed)
Patient returned today to get his testosterone injection, patient denies CP,Dizziness,Palpitations and mood changes injection given patient tolerated well and was advised to return in 14 days for his next injection.

## 2023-05-12 ENCOUNTER — Encounter: Payer: Self-pay | Admitting: Neurology

## 2023-05-16 ENCOUNTER — Ambulatory Visit: Payer: Self-pay | Admitting: Neurology

## 2023-05-16 DIAGNOSIS — G4733 Obstructive sleep apnea (adult) (pediatric): Secondary | ICD-10-CM | POA: Diagnosis not present

## 2023-05-16 DIAGNOSIS — E66813 Obesity, class 3: Secondary | ICD-10-CM

## 2023-05-16 DIAGNOSIS — Z9884 Bariatric surgery status: Secondary | ICD-10-CM

## 2023-05-20 ENCOUNTER — Ambulatory Visit (INDEPENDENT_AMBULATORY_CARE_PROVIDER_SITE_OTHER): Payer: BC Managed Care – PPO | Admitting: Family Medicine

## 2023-05-20 VITALS — BP 131/82 | HR 72

## 2023-05-20 DIAGNOSIS — E291 Testicular hypofunction: Secondary | ICD-10-CM | POA: Diagnosis not present

## 2023-05-20 MED ORDER — TESTOSTERONE CYPIONATE 200 MG/ML IM SOLN
200.0000 mg | Freq: Once | INTRAMUSCULAR | Status: AC
Start: 2023-05-20 — End: 2023-05-20
  Administered 2023-05-20: 200 mg via INTRAMUSCULAR

## 2023-05-20 NOTE — Progress Notes (Signed)
Pt here for testosterone injection no SOB,CP or mood swings. Injection tolerated well given in LUOQ. Pt to RTC in 2 weeks for next injection  

## 2023-05-25 NOTE — Progress Notes (Signed)
[facility]  Male, 57 y.o., 25-Nov-1965    HOME SLEEP TEST REPORT ( by Watch PAT)   STUDY DATE: 05-25-2023 ( mailed out test kit )_      ORDERING CLINICIAN: Margie Ege, NP REFERRING CLINICIAN: Gaynelle Adu, MD PCP : Everrett Coombe, DO   CLINICAL INFORMATION/HISTORY: Gastric bypass patient  with low testosterone levels, anemia, He underwent Bariatric surgery on 10-25-2022.  Was seen on 04-19-2023 by NP Christia Reading : -Retest HST ordered given over 90 pound weight loss since gastric bypass in March 2024.  He had HST in June 2023 showing moderate to severe sleep apnea that was strongly REM sleep dependent.  Nadir 02 70% -Weight loss was very important.  He had a CPAP titration in July 2023 that responded well to 12 cm water.   For now, he will continue CPAP use.  He has superb compliance.  If significant OSA is no longer apparent, we will discontinue CPAP use.  For now, we will schedule prn 1 year follow-up if the patient remains on CPAP.   Epworth sleepiness score: 2/24.   BMI: 35.5 kg/m   Neck Circumference: NA   FINDINGS:   Sleep Summary:   Total Recording Time (hours, min): 7 hours 48 minutes      Total Sleep Time (hours, min):   6 hours 31 minutes              Percent REM (%):   21%                                     Respiratory Indices:   Calculated pAHI (per hour): 2.0/h, applying AASM criteria.                           REM pAHI:    5.9/h                                             NREM pAHI:    1.0/h                          Positional AHI: The patient slept 203 minutes in prone position with an AHI of 2.4 the rest of the night on his left with an AHI of 1.4/h and 56 minutes on his right with an AHI of 0/h.  Snoring level reached a mean volume of 42 dB, present for about 50% of total sleep time                                                 Oxygen Saturation Statistics:   O2 Saturation Range (%): Between the nadir at 87% (up from 70% and  his study from last year prior to gastric bypass) , and the maximum saturation of 100% with a mean saturation of 97%.                                    O2 Saturation (minutes) <89%: 0.3 minutes  Pulse Rate Statistics:   Pulse Mean (bpm):   61 bpm              Pulse Range: Between 49 and 107 bpm.                 IMPRESSION:  This HST confirms that sleep apnea is no longer present.  There were no central apneas noted and all obstructive events have resolved on the weight loss status post gastric bypass surgery.  There was some mild to moderate snoring noted which seemed not to be positional dependent.  RECOMMENDATION: There is no longer an indication for CPAP use. Sleep neurology will sign off.  No follow up is necessary.     INTERPRETING PHYSICIAN:   Melvyn Novas, MD

## 2023-05-27 NOTE — Procedures (Signed)
[facility]  Male, 57 y.o., 25-Nov-1965    HOME SLEEP TEST REPORT ( by Watch PAT)   STUDY DATE: 05-25-2023 ( mailed out test kit )_      ORDERING CLINICIAN: Margie Ege, NP REFERRING CLINICIAN: Gaynelle Adu, MD PCP : Everrett Coombe, DO   CLINICAL INFORMATION/HISTORY: Gastric bypass patient  with low testosterone levels, anemia, He underwent Bariatric surgery on 10-25-2022.  Was seen on 04-19-2023 by NP Christia Reading : -Retest HST ordered given over 90 pound weight loss since gastric bypass in March 2024.  He had HST in June 2023 showing moderate to severe sleep apnea that was strongly REM sleep dependent.  Nadir 02 70% -Weight loss was very important.  He had a CPAP titration in July 2023 that responded well to 12 cm water.   For now, he will continue CPAP use.  He has superb compliance.  If significant OSA is no longer apparent, we will discontinue CPAP use.  For now, we will schedule prn 1 year follow-up if the patient remains on CPAP.   Epworth sleepiness score: 2/24.   BMI: 35.5 kg/m   Neck Circumference: NA   FINDINGS:   Sleep Summary:   Total Recording Time (hours, min): 7 hours 48 minutes      Total Sleep Time (hours, min):   6 hours 31 minutes              Percent REM (%):   21%                                     Respiratory Indices:   Calculated pAHI (per hour): 2.0/h, applying AASM criteria.                           REM pAHI:    5.9/h                                             NREM pAHI:    1.0/h                          Positional AHI: The patient slept 203 minutes in prone position with an AHI of 2.4 the rest of the night on his left with an AHI of 1.4/h and 56 minutes on his right with an AHI of 0/h.  Snoring level reached a mean volume of 42 dB, present for about 50% of total sleep time                                                 Oxygen Saturation Statistics:   O2 Saturation Range (%): Between the nadir at 87% (up from 70% and  his study from last year prior to gastric bypass) , and the maximum saturation of 100% with a mean saturation of 97%.                                    O2 Saturation (minutes) <89%: 0.3 minutes  Pulse Rate Statistics:   Pulse Mean (bpm):   61 bpm              Pulse Range: Between 49 and 107 bpm.                 IMPRESSION:  This HST confirms that sleep apnea is no longer present.  There were no central apneas noted and all obstructive events have resolved on the weight loss status post gastric bypass surgery.  There was some mild to moderate snoring noted which seemed not to be positional dependent.  RECOMMENDATION: There is no longer an indication for CPAP use. Sleep neurology will sign off.  No follow up is necessary.     INTERPRETING PHYSICIAN:   Melvyn Novas, MD

## 2023-05-30 ENCOUNTER — Telehealth: Payer: Self-pay | Admitting: Neurology

## 2023-05-30 DIAGNOSIS — G4733 Obstructive sleep apnea (adult) (pediatric): Secondary | ICD-10-CM

## 2023-05-30 NOTE — Telephone Encounter (Signed)
Recent HST shows the sleep apnea has resolved, CPAP is no longer needed. I will d/c cpap. I sent patient my chart message. Please send order to DME. We can cancel any future follow appointments scheduled. Thanks, Nazaret Chea   Orders Placed This Encounter  Procedures   For home use only DME continuous positive airway pressure (CPAP)    Discontinue CPAP, sleep apnea has resolved.    Order Specific Question:   Length of Need    Answer:   6 Months    Order Specific Question:   Patient has OSA or probable OSA    Answer:   No    Order Specific Question:   Is the patient currently using CPAP in the home    Answer:   Yes    Order Specific Question:   Settings    Answer:   Other see comments    Order Specific Question:   CPAP supplies needed    Answer:   Mask, headgear, cushions, filters, heated tubing and water chamber   IMPRESSION:  This HST confirms that sleep apnea is no longer present.  There were no central apneas noted and all obstructive events have resolved on the weight loss status post gastric bypass surgery.  There was some mild to moderate snoring noted which seemed not to be positional dependent.   RECOMMENDATION: There is no longer an indication for CPAP use. Sleep neurology will sign off.  No follow up is necessary.

## 2023-05-30 NOTE — Telephone Encounter (Signed)
Order from sarah sent to dme via epic [facility] msg

## 2023-06-03 ENCOUNTER — Ambulatory Visit: Payer: BC Managed Care – PPO

## 2023-06-09 ENCOUNTER — Ambulatory Visit: Payer: BC Managed Care – PPO

## 2023-06-17 ENCOUNTER — Other Ambulatory Visit: Payer: Self-pay | Admitting: Family Medicine

## 2023-06-17 ENCOUNTER — Ambulatory Visit (INDEPENDENT_AMBULATORY_CARE_PROVIDER_SITE_OTHER): Payer: Self-pay | Admitting: Family Medicine

## 2023-06-17 VITALS — BP 121/87 | HR 61 | Temp 97.7°F | Ht 67.0 in | Wt 208.0 lb

## 2023-06-17 DIAGNOSIS — E291 Testicular hypofunction: Secondary | ICD-10-CM

## 2023-06-17 MED ORDER — TESTOSTERONE CYPIONATE 200 MG/ML IM SOLN
200.0000 mg | Freq: Once | INTRAMUSCULAR | Status: AC
  Administered 2023-06-17: 200 mg via INTRAMUSCULAR

## 2023-06-17 MED ORDER — VALACYCLOVIR HCL 1 G PO TABS: 1000.0000 mg | ORAL_TABLET | Freq: Three times a day (TID) | ORAL | 0 refills | Status: AC

## 2023-06-17 NOTE — Progress Notes (Signed)
Patient is here for a testosterone injection. Denies chest pain, shortness of breath, headaches and problems with medication or mood changes.   During the NV, patient mentioned he has been having irritation, skin sensitivity, itchy, and pain on the right side of his back. The provider evaluated the patient and has recommended valacyclovir. Patient aware to pick up the rx at the pharmacy.   Patient tolerated injection on RUOQ well without complications. Patient advised to schedule next injection in 14 days.

## 2023-06-17 NOTE — Progress Notes (Signed)
Patient has vesicular rash with pain in dermatomal pattern along the R upper back.  Describes as itching and burning in nature.  Present for a couple of days.  Adding valtrex.  Red flags and precautions discussed.

## 2023-06-27 ENCOUNTER — Ambulatory Visit: Payer: BC Managed Care – PPO | Admitting: Family Medicine

## 2023-06-27 ENCOUNTER — Encounter: Payer: Self-pay | Admitting: Family Medicine

## 2023-06-27 VITALS — BP 119/81 | HR 68 | Ht 67.0 in | Wt 207.0 lb

## 2023-06-27 DIAGNOSIS — L219 Seborrheic dermatitis, unspecified: Secondary | ICD-10-CM | POA: Diagnosis not present

## 2023-06-27 DIAGNOSIS — B0229 Other postherpetic nervous system involvement: Secondary | ICD-10-CM | POA: Diagnosis not present

## 2023-06-27 MED ORDER — KETOCONAZOLE 2 % EX SHAM: 1.0000 | MEDICATED_SHAMPOO | CUTANEOUS | 0 refills | Status: DC

## 2023-06-27 MED ORDER — GABAPENTIN 300 MG PO CAPS: ORAL_CAPSULE | ORAL | 3 refills | Status: DC

## 2023-06-27 NOTE — Patient Instructions (Signed)
Try zostrix (capsaicin) cream.  Wash hands thoroughly after use.   Try gabapentin.   Try ketoconazole shampoo for the face.    See me in 1 month.

## 2023-06-27 NOTE — Assessment & Plan Note (Signed)
Adding gabapentin.  Discussed that he may also try capsaicin cream to area.  Can consider lidoderm patches if symptoms remain refractory to above.

## 2023-06-27 NOTE — Assessment & Plan Note (Signed)
Adding nizoral shampoo.  He will let me know if not improving with this.

## 2023-06-27 NOTE — Progress Notes (Signed)
William Rose - 57 y.o. male MRN [id number]  Date of birth: July 10, 1966  Subjective Chief Complaint  Patient presents with   Herpes Zoster   Rash    HPI William Rose is a 57 y.o. male here today with complaint of pain in the upper back.  Here recently and was noted to have rash in dermatomal pattern consistent with shingles.  Treated with valtrex.  He reports continued pain across the back.  Rash has subsided.  He denies any other changes at this time.    He does have a rash on his forehead and face.  Has been using selenium sulfide shampoo without much improvement.    ROS:  A comprehensive ROS was completed and negative except as noted per HPI  No Known Allergies  Past Medical History:  Diagnosis Date   Headache    Sleep apnea     Past Surgical History:  Procedure Laterality Date   CARPAL TUNNEL RELEASE Bilateral    GASTRIC ROUX-EN-Y N/A 10/25/2022   Procedure: LAPAROSCOPIC ROUX-EN-Y GASTRIC BYPASS WITH UPPER ENDOSCOPY;  Surgeon: Gaynelle Adu, MD;  Location: [facility];  Service: General;  Laterality: N/A;    Social History   Socioeconomic History   Marital status: Married    Spouse name: Debby Bud   Number of children: 4   Years of education: Not on file   Highest education level: Some college, no degree  Occupational History    Employer: [employer]  Tobacco Use   Smoking status: Former    Current packs/day: 0.00    Average packs/day: 1 pack/day for 30.0 years (30.0 ttl pk-yrs)    Types: Cigarettes    Start date: 08/10/1987    Quit date: 08/09/2017    Years since quitting: 5.8   Smokeless tobacco: Never  Vaping Use   Vaping status: Never Used  Substance and Sexual Activity   Alcohol use: Never   Drug use: Never   Sexual activity: Yes    Partners: Female  Other Topics Concern   Not on file  Social History Narrative   Lives with wife   R handed   Caffeine: use to drink 16 C of coffee a day and now 8 coffee a day   Social Determinants of Health    Financial Resource Strain: Medium Risk (06/26/2023)   Overall Financial Resource Strain (CARDIA)    Difficulty of Paying Living Expenses: Somewhat hard  Food Insecurity: Food Insecurity Present (06/26/2023)   Hunger Vital Sign    Worried About Running Out of Food in the Last Year: Sometimes true    Ran Out of Food in the Last Year: Sometimes true  Transportation Needs: No Transportation Needs (06/26/2023)   PRAPARE - Administrator, Civil Service (Medical): No    Lack of Transportation (Non-Medical): No  Physical Activity: Sufficiently Active (06/26/2023)   Exercise Vital Sign    Days of Exercise per Week: 5 days    Minutes of Exercise per Session: 40 min  Stress: Stress Concern Present (06/26/2023)   [employer] of Occupational Health - Occupational Stress Questionnaire    Feeling of Stress : Very much  Social Connections: Moderately Integrated (06/26/2023)   Social Connection and Isolation Panel [NHANES]    Frequency of Communication with Friends and Family: More than three times a week    Frequency of Social Gatherings with Friends and Family: Once a week    Attends Religious Services: More than 4 times per year    Active Member of  Clubs or Organizations: No    Attends Engineer, structural: Not on file    Marital Status: Married    Family History  Problem Relation Age of Onset   Epilepsy Mother     Health Maintenance  Topic Date Due   Zoster Vaccines- Shingrix (1 of 2) Never done   COVID-19 Vaccine (3 - 2023-24 season) 04/10/2023   INFLUENZA VACCINE  11/07/2023 (Originally 03/10/2023)   Hepatitis C Screening  02/24/2024 (Originally 01/04/1985)   HIV Screening  02/24/2024 (Originally 01/04/1982)   Lung Cancer Screening  03/17/2024 (Originally 01/04/2017)   Colonoscopy  08/09/2026   DTaP/Tdap/Td (2 - Td or Tdap) 10/11/2030   HPV VACCINES  Aged Out      ----------------------------------------------------------------------------------------------------------------------------------------------------------------------------------------------------------------- Physical Exam BP 119/81 (BP Location: Right Arm, Patient Position: Sitting, Cuff Size: Large)   Pulse 68   Ht 5\' 7"  (1.702 m)   Wt 207 lb (93.9 kg)   SpO2 99%   BMI 32.42 kg/m   Physical Exam Constitutional:      Appearance: Normal appearance.  Skin:    Comments: No visible rash on back.  TTP over site of previous rash.   Neurological:     Mental Status: He is alert.     ------------------------------------------------------------------------------------------------------------------------------------------------------------------------------------------------------------------- Assessment and Plan  Seborrheic dermatitis Adding nizoral shampoo.  He will let me know if not improving with this.   Post herpetic neuralgia Adding gabapentin.  Discussed that he may also try capsaicin cream to area.  Can consider lidoderm patches if symptoms remain refractory to above.     Meds ordered this encounter  Medications   ketoconazole (NIZORAL) 2 % shampoo    Sig: Apply 1 Application topically 3 (three) times a week.    Dispense:  120 mL    Refill:  0   gabapentin (NEURONTIN) 300 MG capsule    Sig: Take 1 capsule at night for first 7 days.  May increase to 1 capsule three times per day if needed.    Dispense:  90 capsule    Refill:  3    Return in about 4 weeks (around 07/25/2023) for f/u post herpetic neuralgia. .    This visit occurred during the SARS-CoV-2 public health emergency.  Safety protocols were in place, including screening questions prior to the visit, additional usage of staff PPE, and extensive cleaning of exam room while observing appropriate contact time as indicated for disinfecting solutions.

## 2023-07-01 ENCOUNTER — Ambulatory Visit (INDEPENDENT_AMBULATORY_CARE_PROVIDER_SITE_OTHER): Payer: BC Managed Care – PPO | Admitting: Family Medicine

## 2023-07-01 VITALS — BP 131/73 | HR 65

## 2023-07-01 DIAGNOSIS — E291 Testicular hypofunction: Secondary | ICD-10-CM

## 2023-07-01 MED ORDER — TESTOSTERONE CYPIONATE 200 MG/ML IM SOLN
200.0000 mg | Freq: Once | INTRAMUSCULAR | Status: AC
  Administered 2023-07-01: 200 mg via INTRAMUSCULAR

## 2023-07-01 NOTE — Progress Notes (Signed)
Pt here for testosterone injection no SOB,CP or mood swings. Injection tolerated well given in LUOQ. Pt to RTC in 2 weeks for next injection

## 2023-07-15 ENCOUNTER — Ambulatory Visit (INDEPENDENT_AMBULATORY_CARE_PROVIDER_SITE_OTHER): Payer: BC Managed Care – PPO | Admitting: Family Medicine

## 2023-07-15 VITALS — BP 119/80 | HR 59 | Ht 67.0 in | Wt 207.0 lb

## 2023-07-15 DIAGNOSIS — E291 Testicular hypofunction: Secondary | ICD-10-CM | POA: Diagnosis not present

## 2023-07-15 MED ORDER — TESTOSTERONE CYPIONATE 200 MG/ML IM SOLN
200.0000 mg | Freq: Once | INTRAMUSCULAR | Status: AC
  Administered 2023-07-15: 200 mg via INTRAMUSCULAR

## 2023-07-15 NOTE — Progress Notes (Unsigned)
Pt here for testosterone injection no SOB,CP or mood swings. Injection tolerated well given in RUOQ. Pt will RTC in 2 weeks for next injection.

## 2023-07-17 NOTE — Progress Notes (Signed)
Medical screening examination/treatment was performed by qualified clinical staff member and as supervising physician I was immediately available for consultation/collaboration. I have reviewed documentation and agree with assessment and plan.  Ayme Short, DO  

## 2023-07-28 ENCOUNTER — Encounter: Payer: BC Managed Care – PPO | Attending: General Surgery | Admitting: Dietician

## 2023-07-28 ENCOUNTER — Encounter: Payer: Self-pay | Admitting: Dietician

## 2023-07-28 DIAGNOSIS — E669 Obesity, unspecified: Secondary | ICD-10-CM | POA: Insufficient documentation

## 2023-07-28 NOTE — Progress Notes (Signed)
Bariatric Nutrition Follow-Up Visit Medical Nutrition Therapy  Appt Start Time: 3:10   End Time: 4:00   in person Spanish interpretor was used for this visit.  Surgery date: 10/25/2022 Surgery type: RYGB  NUTRITION ASSESSMENT  Anthropometrics  Start weight at [facility]: 300.0 lbs (date: 06/01/2022) or 136.3 kg Height: 67 in Weight today:  lbs. (99.6 kg)   Clinical  Medical hx: Obesity, sleep apnea Medications: Vit D, semaglutide   Labs: A1C 5.7; HDL 36; LDL 106 Notable signs/symptoms: nothing noted Any previous deficiencies? No Bowel Habits: some constipation; using Miralax    Body Composition Scale 11/09/2022 01/25/2023 04/28/2023 07/28/2023  Current Body Weight 287.2 237.1 220.0 204.5  Total Body Fat % 39.1 32.5 30.0 27.3  Visceral Fat 36 25 21 18   Fat-Free Mass % 60.8 67.4 69.9 72.6   Total Body Water % 41.8 48.4 50.9 53.6  Muscle-Mass lbs 54.2 44.6 41.3 38.4  BMI 46.3 36.8 34.1 31.7  Body Fat Displacement             Torso  lbs 69.8 47.8 40.8 34.5         Left Leg  lbs 13.9 9.5 8.1 6.9         Right Leg  lbs 13.9 9.5 8.1 6.9         Left Arm  lbs 6.9 4.7 4.0 3.4         Right Arm  lbs 6.9 4.7 4.0 3.4    Lifestyle & Dietary Hx  Pt states he can eat slowly at home, and lunch at work, stating he is at work 10-12 hours a day, and has to break away to eat snacks, stating he eats it too fast and his stomach hurts. Pt states his concern is constipation, stating he has a bowel movement every 3-4 days. Pt states he drinks 3 protein shakes per day.  Estimated daily fluid intake: 64 oz Estimated daily protein intake: 80 g Supplements: multivitamin and calcium Current average weekly physical activity: gym 3-4 days a week, 120 minutes   24-Hr Dietary Recall First Meal: rice cakes with cream cheese, no sugar jelly, half hard boiled egg, protein shake Snack:  yogurt Second Meal: yogurt, cheese, protein shake Snack:  cheese Third Meal: pork, potatoes, peas, protein  shake Snack:  Beverages: water, vitamin water zero, mate (tea)  Post-Op Goals/ Signs/ Symptoms Using straws: no Drinking while eating: no Chewing/swallowing difficulties: no Changes in vision: no Changes to mood/headaches: no Hair loss/changes to skin/nails: no Difficulty focusing/concentrating: no Sweating: no Limb weakness: no Dizziness/lightheadedness: no Palpitations: no  Carbonated/caffeinated beverages: no N/V/D/C/Gas: bowel movement every 3-4 days Abdominal pain: when eating too fast Dumping syndrome: no   NUTRITION DIAGNOSIS  Overweight/obesity ([place]-3.3) related to past poor dietary habits and physical inactivity as evidenced by completed bariatric surgery and following dietary guidelines for continued weight loss and healthy nutrition status.   NUTRITION INTERVENTION Nutrition counseling (C-1) and education (E-2) to facilitate bariatric surgery goals, including: The importance of consuming adequate calories as well as certain nutrients daily due to the body's need for essential vitamins, minerals, and fats The importance of daily physical activity and to reach a goal of at least 150 minutes of moderate to vigorous physical activity weekly (or as directed by their physician) due to benefits such as increased musculature and improved lab values The importance of intuitive eating specifically learning hunger-satiety cues and understanding the importance of learning a new body: The importance of mindful eating to avoid grazing behaviors  Encouraged patient to honor their body's internal hunger and fullness cues.  Throughout the day, check in mentally and rate hunger. Stop eating when satisfied not full regardless of how much food is left on the plate.  Get more if still hungry 20-30 minutes later.  The key is to honor satisfaction so throughout the meal, rate fullness factor and stop when comfortably satisfied not physically full. The key is to honor hunger and fullness without any  feelings of guilt or shame.  Pay attention to what the internal cues are, rather than any external factors. This will enhance the confidence you have in listening to your own body and following those internal cues enabling you to increase how often you eat when you are hungry not out of appetite and stop when you are satisfied not full.  Encouraged pt to continue to eat balanced meals inclusive of non starchy vegetables 2 times a day 7 days a week Encouraged pt to continue to drink a minium 64 fluid ounces with half being plain water to satisfy proper hydration   Why you need complex carbohydrates: Whole grains and other complex carbohydrates are required to have a healthy diet. Whole grains provide fiber which can help with blood glucose levels and help keep you satiated. Fruits and starchy vegetables provide essential vitamins and minerals required for immune function, eyesight support, brain support, bone density, wound healing and many other functions within the body. According to the current evidenced based 2020-2025 Dietary Guidelines for Americans, complex carbohydrates are part of a healthy eating pattern which is associated with a decreased risk for type 2 diabetes, cancers, and cardiovascular disease.   Goals Increase fluid intake; aim for 64 oz of hydrating fluids Don't avoid carbohydrates; aim for complex carbohydrates that are also a protein source (i.e. beans, quinoa, etc...). More protein from foods; less protein shakes  Handouts Provided Include  Bariatric MyPlate (Spanish)  Learning Style & Readiness for Change Teaching method utilized: Visual & Auditory  Demonstrated degree of understanding via: Teach Back  Readiness Level: ready Barriers to learning/adherence to lifestyle change: busy work schedule  RD's Notes for Next Visit Assess adherence to pt chosen goals  MONITORING & EVALUATION Dietary intake, weekly physical activity, body weight.  Next Steps Patient is to  follow-up in 3 months for 1 year post-op follow-up.

## 2023-07-29 ENCOUNTER — Ambulatory Visit: Payer: BC Managed Care – PPO

## 2023-08-05 ENCOUNTER — Ambulatory Visit: Payer: BC Managed Care – PPO | Admitting: Family Medicine

## 2023-08-08 ENCOUNTER — Ambulatory Visit: Payer: BC Managed Care – PPO

## 2023-08-08 NOTE — Progress Notes (Deleted)
HPI  Pt here for testosterone injection 200 mg. Denies SOB, CP, or mood swings.        Assessment and Plan:   Pt tolerated injection well given in LUOQ 200mg  . Pt advised to return in 2 weeks around 08/22/23 for testosterone injection

## 2023-08-09 NOTE — Progress Notes (Unsigned)
 Opened in error pt no showed. William Rose, CMA

## 2023-08-26 ENCOUNTER — Ambulatory Visit: Payer: BC Managed Care – PPO | Admitting: Family Medicine

## 2023-08-26 ENCOUNTER — Encounter: Payer: Self-pay | Admitting: Family Medicine

## 2023-08-26 VITALS — BP 116/77 | HR 67 | Ht 67.0 in | Wt 204.0 lb

## 2023-08-26 DIAGNOSIS — R7989 Other specified abnormal findings of blood chemistry: Secondary | ICD-10-CM | POA: Diagnosis not present

## 2023-08-26 DIAGNOSIS — E291 Testicular hypofunction: Secondary | ICD-10-CM | POA: Diagnosis not present

## 2023-08-26 DIAGNOSIS — B0229 Other postherpetic nervous system involvement: Secondary | ICD-10-CM | POA: Diagnosis not present

## 2023-08-26 MED ORDER — TESTOSTERONE CYPIONATE 200 MG/ML IM SOLN
200.0000 mg | INTRAMUSCULAR | Status: AC
  Administered 2023-08-26 – 2023-11-16 (×4): 200 mg via INTRAMUSCULAR

## 2023-08-26 MED ORDER — PREGABALIN 75 MG PO CAPS: 75.0000 mg | ORAL_CAPSULE | Freq: Two times a day (BID) | ORAL | 1 refills | Status: AC

## 2023-08-26 MED ORDER — LIDOCAINE 5 % EX PTCH: 1.0000 | MEDICATED_PATCH | CUTANEOUS | 0 refills | Status: AC

## 2023-08-26 NOTE — Progress Notes (Unsigned)
William Rose - 58 y.o. male MRN [id number]  Date of birth: 09-13-1965  Subjective Chief Complaint  Patient presents with  . Herpes Zoster    HPI William Rose is a 58 y.o. male here today for follow up of post-herpetic neuralgia.  He has been prescribed gabapentin and reports that this is ***.  He still has some rash with itching and burning sensation.  Gabapentin did seem to help some.  No Known Allergies  Past Medical History:  Diagnosis Date  . Headache   . Sleep apnea     Past Surgical History:  Procedure Laterality Date  . CARPAL TUNNEL RELEASE Bilateral   . GASTRIC ROUX-EN-Y N/A 10/25/2022   Procedure: LAPAROSCOPIC ROUX-EN-Y GASTRIC BYPASS WITH UPPER ENDOSCOPY;  Surgeon: Gaynelle Adu, MD;  Location: [facility];  Service: General;  Laterality: N/A;    Social History   Socioeconomic History  . Marital status: Married    Spouse name: Debby Bud  . Number of children: 4  . Years of education: Not on file  . Highest education level: Some college, no degree  Occupational History    Employer: [employer]  Tobacco Use  . Smoking status: Former    Current packs/day: 0.00    Average packs/day: 1 pack/day for 30.0 years (30.0 ttl pk-yrs)    Types: Cigarettes    Start date: 08/10/1987    Quit date: 08/09/2017    Years since quitting: 6.0  . Smokeless tobacco: Never  Vaping Use  . Vaping status: Never Used  Substance and Sexual Activity  . Alcohol use: Never  . Drug use: Never  . Sexual activity: Yes    Partners: Female  Other Topics Concern  . Not on file  Social History Narrative   Lives with wife   R handed   Caffeine: use to drink 16 C of coffee a day and now 8 coffee a day   Social Drivers of Corporate investment banker Strain: Low Risk  (08/25/2023)   Overall Financial Resource Strain (CARDIA)   . Difficulty of Paying Living Expenses: Not hard at all  Recent Concern: Financial Resource Strain - Medium Risk (06/26/2023)   Overall Financial Resource Strain  (CARDIA)   . Difficulty of Paying Living Expenses: Somewhat hard  Food Insecurity: No Food Insecurity (08/25/2023)   Hunger Vital Sign   . Worried About Programme researcher, broadcasting/film/video in the Last Year: Never true   . Ran Out of Food in the Last Year: Never true  Recent Concern: Food Insecurity - Food Insecurity Present (06/26/2023)   Hunger Vital Sign   . Worried About Programme researcher, broadcasting/film/video in the Last Year: Sometimes true   . Ran Out of Food in the Last Year: Sometimes true  Transportation Needs: No Transportation Needs (08/25/2023)   PRAPARE - Transportation   . Lack of Transportation (Medical): No   . Lack of Transportation (Non-Medical): No  Physical Activity: Sufficiently Active (08/25/2023)   Exercise Vital Sign   . Days of Exercise per Week: 5 days   . Minutes of Exercise per Session: 60 min  Stress: Stress Concern Present (08/25/2023)   [employer] of Occupational Health - Occupational Stress Questionnaire   . Feeling of Stress : Rather much  Social Connections: Moderately Integrated (08/25/2023)   Social Connection and Isolation Panel [NHANES]   . Frequency of Communication with Friends and Family: More than three times a week   . Frequency of Social Gatherings with Friends and Family: Twice a week   .  Attends Religious Services: 1 to 4 times per year   . Active Member of Clubs or Organizations: No   . Attends Banker Meetings: Not on file   . Marital Status: Married    Family History  Problem Relation Age of Onset  . Epilepsy Mother     Health Maintenance  Topic Date Due  . INFLUENZA VACCINE  11/07/2023 (Originally 03/10/2023)  . Hepatitis C Screening  02/24/2024 (Originally 01/04/1985)  . HIV Screening  02/24/2024 (Originally 01/04/1982)  . Lung Cancer Screening  03/17/2024 (Originally 01/04/2017)  . COVID-19 Vaccine (3 - 2024-25 season) 07/16/2024 (Originally 04/10/2023)  . Zoster Vaccines- Shingrix (1 of 2) 07/30/2024 (Originally 01/04/2017)  . Colonoscopy   08/09/2026  . DTaP/Tdap/Td (2 - Td or Tdap) 10/11/2030  . HPV VACCINES  Aged Out     ----------------------------------------------------------------------------------------------------------------------------------------------------------------------------------------------------------------- Physical Exam There were no vitals taken for this visit.  Physical Exam  ------------------------------------------------------------------------------------------------------------------------------------------------------------------------------------------------------------------- Assessment and Plan  No problem-specific Assessment & Plan notes found for this encounter.   No orders of the defined types were placed in this encounter.   No follow-ups on file.    This visit occurred during the SARS-CoV-2 public health emergency.  Safety protocols were in place, including screening questions prior to the visit, additional usage of staff PPE, and extensive cleaning of exam room while observing appropriate contact time as indicated for disinfecting solutions.

## 2023-08-26 NOTE — Progress Notes (Unsigned)
Patient is here for a testosterone injection of 200mg/ml.  Given in LUOQ.  Denies chest pain, shortness of breath, headaches and problems with medication or mood changes.  Tolerated injection well without complications.   Patient advised to schedule next injection in 14 days.  

## 2023-08-26 NOTE — Patient Instructions (Signed)
Start Lyrica 75mg  twice per day.   Try lidocaine 5% patch.   See me again in 4-6 weeks.

## 2023-08-28 NOTE — Assessment & Plan Note (Signed)
Change gabapentin to pregabalin.  Will also send in prescription for Lidoderm patches.  Follow-up in about 6 weeks.

## 2023-08-28 NOTE — Assessment & Plan Note (Signed)
Testosterone injection given today. 

## 2023-08-29 ENCOUNTER — Ambulatory Visit: Payer: BC Managed Care – PPO | Admitting: Family Medicine

## 2023-09-09 ENCOUNTER — Ambulatory Visit (INDEPENDENT_AMBULATORY_CARE_PROVIDER_SITE_OTHER): Payer: BC Managed Care – PPO | Admitting: Family Medicine

## 2023-09-09 ENCOUNTER — Encounter: Payer: Self-pay | Admitting: Family Medicine

## 2023-09-09 VITALS — BP 128/77 | HR 73

## 2023-09-09 DIAGNOSIS — E291 Testicular hypofunction: Secondary | ICD-10-CM

## 2023-09-09 MED ORDER — TESTOSTERONE CYPIONATE 200 MG/ML IM SOLN
200.0000 mg | Freq: Once | INTRAMUSCULAR | Status: AC
  Administered 2023-09-09: 200 mg via INTRAMUSCULAR

## 2023-09-09 NOTE — Progress Notes (Signed)
Pt here for testosterone injection no SOB,CP or mood swings.  Injection tolerated well given in ROUQ.  Pt will RTC in 14 days for next injection.

## 2023-09-23 ENCOUNTER — Ambulatory Visit: Payer: BC Managed Care – PPO

## 2023-09-29 ENCOUNTER — Ambulatory Visit: Payer: BC Managed Care – PPO

## 2023-09-29 DIAGNOSIS — E291 Testicular hypofunction: Secondary | ICD-10-CM

## 2023-09-29 NOTE — Progress Notes (Signed)
HPI  Pt here today for testosterone injection. Denies CP, SOB, mood swings, or headaches.                      Assessment and Plan:  Pt received 200mg  in his LOUQ. Tolerated well. No redness or swelling noted at site. Pt advised to RTC in 14 days (around 10/13/23).

## 2023-10-05 ENCOUNTER — Ambulatory Visit: Payer: BC Managed Care – PPO

## 2023-10-07 ENCOUNTER — Ambulatory Visit: Payer: BC Managed Care – PPO | Admitting: Family Medicine

## 2023-10-13 ENCOUNTER — Encounter: Payer: Self-pay | Admitting: Family Medicine

## 2023-10-13 ENCOUNTER — Ambulatory Visit: Payer: BC Managed Care – PPO | Admitting: Family Medicine

## 2023-10-13 VITALS — BP 112/64 | HR 66 | Ht 67.0 in | Wt 204.8 lb

## 2023-10-13 DIAGNOSIS — E291 Testicular hypofunction: Secondary | ICD-10-CM

## 2023-10-13 NOTE — Progress Notes (Signed)
 Pt here today for testosterone injection. Denies CP, SOB, mood swings, or headaches.              Assessment and Plan:   Pt received 200mg  in his ROUQ. Tolerated well. No redness or swelling noted at site. Pt advised to RTC in 14 days (around 10/13/23).

## 2023-10-13 NOTE — Progress Notes (Deleted)
           ere today for testosterone injection. Denies CP, SOB, mood swings, or headaches.

## 2023-10-25 ENCOUNTER — Ambulatory Visit: Payer: BC Managed Care – PPO | Admitting: Dietician

## 2023-10-27 ENCOUNTER — Ambulatory Visit: Payer: BC Managed Care – PPO | Admitting: Family Medicine

## 2023-11-16 ENCOUNTER — Ambulatory Visit

## 2023-11-16 ENCOUNTER — Ambulatory Visit: Admitting: Family Medicine

## 2023-11-16 VITALS — BP 107/69 | HR 67 | Ht 67.0 in | Wt 204.8 lb

## 2023-11-16 DIAGNOSIS — R7989 Other specified abnormal findings of blood chemistry: Secondary | ICD-10-CM | POA: Diagnosis not present

## 2023-11-16 NOTE — Progress Notes (Unsigned)
Patient is here for a testosterone injection of 200mg/ml.  Given in LUOQ.  Denies chest pain, shortness of breath, headaches and problems with medication or mood changes.  Tolerated injection well without complications.   Patient advised to schedule next injection in 14 days.  

## 2023-11-30 ENCOUNTER — Ambulatory Visit (INDEPENDENT_AMBULATORY_CARE_PROVIDER_SITE_OTHER): Admitting: Family Medicine

## 2023-11-30 VITALS — BP 126/67 | HR 87

## 2023-11-30 DIAGNOSIS — E291 Testicular hypofunction: Secondary | ICD-10-CM | POA: Diagnosis not present

## 2023-11-30 MED ORDER — TESTOSTERONE CYPIONATE 200 MG/ML IM SOLN
200.0000 mg | Freq: Once | INTRAMUSCULAR | Status: AC
  Administered 2023-11-30: 200 mg via INTRAMUSCULAR

## 2023-11-30 NOTE — Progress Notes (Signed)
Pt here for testosterone injection no SOB,CP or mood swings.   Injection tolerated well given in LUOQ.   Pt will RTC in 2 weeks for next injection.

## 2023-12-14 ENCOUNTER — Ambulatory Visit (INDEPENDENT_AMBULATORY_CARE_PROVIDER_SITE_OTHER): Admitting: Family Medicine

## 2023-12-14 VITALS — BP 128/87 | HR 69

## 2023-12-14 DIAGNOSIS — E291 Testicular hypofunction: Secondary | ICD-10-CM

## 2023-12-14 MED ORDER — TESTOSTERONE CYPIONATE 200 MG/ML IM SOLN
200.0000 mg | Freq: Once | INTRAMUSCULAR | Status: AC
  Administered 2023-12-14: 200 mg via INTRAMUSCULAR

## 2023-12-14 NOTE — Progress Notes (Signed)
Pt here for testosterone injection no SOB,CP or mood swings. Injection tolerated well given in RUOQ.  Pt to RTC in 2 weeks for next injection  

## 2023-12-29 ENCOUNTER — Ambulatory Visit

## 2024-04-10 ENCOUNTER — Encounter: Payer: Self-pay | Admitting: Sports Medicine

## 2024-04-18 ENCOUNTER — Ambulatory Visit: Payer: BC Managed Care – PPO | Admitting: Neurology

## 2024-04-23 ENCOUNTER — Encounter: Payer: Self-pay | Admitting: Family Medicine

## 2024-04-23 ENCOUNTER — Ambulatory Visit: Admitting: Family Medicine

## 2024-04-23 VITALS — BP 109/70 | HR 72 | Ht 67.0 in | Wt 207.0 lb

## 2024-04-23 DIAGNOSIS — T753XXA Motion sickness, initial encounter: Secondary | ICD-10-CM | POA: Diagnosis not present

## 2024-04-23 DIAGNOSIS — M25512 Pain in left shoulder: Secondary | ICD-10-CM | POA: Insufficient documentation

## 2024-04-23 MED ORDER — SCOPOLAMINE 1 MG/3DAYS TD PT72: 1.0000 | MEDICATED_PATCH | TRANSDERMAL | 0 refills | Status: AC

## 2024-04-23 MED ORDER — PREDNISONE 10 MG (48) PO TBPK: ORAL_TABLET | Freq: Every day | ORAL | 0 refills | Status: DC

## 2024-04-23 NOTE — Assessment & Plan Note (Signed)
 Likely rotator cuff strain/tendinitis.  X-ray of the shoulder ordered.  Adding prednisone  taper.  Given handout for home exercise plan.  Discussed referral to physical therapy if not improved with home exercises.

## 2024-04-23 NOTE — Progress Notes (Signed)
 William Rose - 58 y.o. male MRN [id number]  Date of birth: Jan 03, 1966  Subjective Chief Complaint  Patient presents with  . Shoulder Pain    HPI William Rose is a 58 y.o. male here today with complaint of left shoulder pain.  He has had this for a few weeks.  He was picking up an iron bar pop in his left shoulder.  He has tried some stretches at home but has not really noted any significant improvement so far.  Has not really taken anything to help with pain either.  He denies weakness, numbness or tingling down the arm.    He is traveling on a cruise soon as well and would like to have scopolamine  patches prescribed to help with motion sickness.  ROS:  A comprehensive ROS was completed and negative except as noted per HPI  No Known Allergies  Past Medical History:  Diagnosis Date  . Headache   . Sleep apnea     Past Surgical History:  Procedure Laterality Date  . CARPAL TUNNEL RELEASE Bilateral   . GASTRIC ROUX-EN-Y N/A 10/25/2022   Procedure: LAPAROSCOPIC ROUX-EN-Y GASTRIC BYPASS WITH UPPER ENDOSCOPY;  Surgeon: Tanda Locus, MD;  Location: [facility];  Service: General;  Laterality: N/A;    Social History   Socioeconomic History  . Marital status: Married    Spouse name: Darin  . Number of children: 4  . Years of education: Not on file  . Highest education level: Some college, no degree  Occupational History    Employer: [employer]  Tobacco Use  . Smoking status: Former    Current packs/day: 0.00    Average packs/day: 1 pack/day for 30.0 years (30.0 ttl pk-yrs)    Types: Cigarettes    Start date: 08/10/1987    Quit date: 08/09/2017    Years since quitting: 6.7  . Smokeless tobacco: Never  Vaping Use  . Vaping status: Never Used  Substance and Sexual Activity  . Alcohol use: Never  . Drug use: Never  . Sexual activity: Yes    Partners: Female  Other Topics Concern  . Not on file  Social History Narrative   Lives with wife   R handed   Caffeine: use to  drink 16 C of coffee a day and now 8 coffee a day   Social Drivers of Corporate investment banker Strain: Low Risk  (08/25/2023)   Overall Financial Resource Strain (CARDIA)   . Difficulty of Paying Living Expenses: Not hard at all  Recent Concern: Financial Resource Strain - Medium Risk (06/26/2023)   Overall Financial Resource Strain (CARDIA)   . Difficulty of Paying Living Expenses: Somewhat hard  Food Insecurity: No Food Insecurity (08/25/2023)   Hunger Vital Sign   . Worried About Programme researcher, broadcasting/film/video in the Last Year: Never true   . Ran Out of Food in the Last Year: Never true  Recent Concern: Food Insecurity - Food Insecurity Present (06/26/2023)   Hunger Vital Sign   . Worried About Programme researcher, broadcasting/film/video in the Last Year: Sometimes true   . Ran Out of Food in the Last Year: Sometimes true  Transportation Needs: No Transportation Needs (08/25/2023)   PRAPARE - Transportation   . Lack of Transportation (Medical): No   . Lack of Transportation (Non-Medical): No  Physical Activity: Sufficiently Active (08/25/2023)   Exercise Vital Sign   . Days of Exercise per Week: 5 days   . Minutes of Exercise per Session: 60  min  Stress: Stress Concern Present (08/25/2023)   [employer] of Occupational Health - Occupational Stress Questionnaire   . Feeling of Stress : Rather much  Social Connections: Moderately Integrated (08/25/2023)   Social Connection and Isolation Panel   . Frequency of Communication with Friends and Family: More than three times a week   . Frequency of Social Gatherings with Friends and Family: Twice a week   . Attends Religious Services: 1 to 4 times per year   . Active Member of Clubs or Organizations: No   . Attends Banker Meetings: Not on file   . Marital Status: Married    Family History  Problem Relation Age of Onset  . Epilepsy Mother     Health Maintenance  Topic Date Due  . HIV Screening  Never done  . Hepatitis C Screening  Never done   . Hepatitis B Vaccines 19-59 Average Risk (1 of 3 - 19+ 3-dose series) Never done  . Lung Cancer Screening  Never done  . Pneumococcal Vaccine: 50+ Years (1 of 1 - PCV) Never done  . Zoster Vaccines- Shingrix (1 of 2) 07/30/2024 (Originally 01/04/2017)  . Influenza Vaccine  11/06/2024 (Originally 03/09/2024)  . COVID-19 Vaccine (3 - 2025-26 season) 05/09/2025 (Originally 04/09/2024)  . Colonoscopy  08/09/2026  . DTaP/Tdap/Td (2 - Td or Tdap) 10/11/2030  . HPV VACCINES  Aged Out  . Meningococcal B Vaccine  Aged Out     ----------------------------------------------------------------------------------------------------------------------------------------------------------------------------------------------------------------- Physical Exam BP 109/70 (BP Location: Left Arm, Patient Position: Sitting, Cuff Size: Normal)   Pulse 72   Ht 5' 7 (1.702 m)   Wt 207 lb (93.9 kg)   SpO2 99%   BMI 32.42 kg/m   Physical Exam Constitutional:      Appearance: Normal appearance.  Musculoskeletal:     Comments: Left shoulder normal to inspection and palpation.  Range of motion is pretty good.  Pain with resisted abduction.  Negative drop arm sign.  Rotator cuff strength testing was normal.  Negative Hawkins and Neer.   Neurological:     Mental Status: He is alert.     ------------------------------------------------------------------------------------------------------------------------------------------------------------------------------------------------------------------- Assessment and Plan  Left shoulder pain Likely rotator cuff strain/tendinitis.  X-ray of the shoulder ordered.  Adding prednisone  taper.  Given handout for home exercise plan.  Discussed referral to physical therapy if not improved with home exercises.   Meds ordered this encounter  Medications  . scopolamine  (TRANSDERM-SCOP) 1 MG/3DAYS    Sig: Place 1 patch (1 mg total) onto the skin every 3 (three) days. For motion  sickness    Dispense:  4 patch    Refill:  0  . predniSONE  (STERAPRED UNI-PAK 48 TAB) 10 MG (48) TBPK tablet    Sig: Take by mouth daily. 12-day taper pack, use as directed for taper    Dispense:  48 tablet    Refill:  0    No follow-ups on file.

## 2024-05-25 ENCOUNTER — Encounter (HOSPITAL_COMMUNITY): Payer: Self-pay | Admitting: *Deleted

## 2024-06-06 ENCOUNTER — Encounter: Payer: Self-pay | Admitting: Family Medicine

## 2024-06-08 MED ORDER — TRIAMCINOLONE ACETONIDE 0.1 % EX CREA: 1.0000 | TOPICAL_CREAM | Freq: Two times a day (BID) | CUTANEOUS | 0 refills | Status: AC

## 2024-06-11 ENCOUNTER — Encounter: Payer: Self-pay | Admitting: Family Medicine

## 2024-06-11 ENCOUNTER — Ambulatory Visit: Admitting: Family Medicine

## 2024-06-11 VITALS — BP 126/74 | HR 72 | Ht 67.0 in | Wt 214.0 lb

## 2024-06-11 DIAGNOSIS — M25512 Pain in left shoulder: Secondary | ICD-10-CM

## 2024-06-11 DIAGNOSIS — E66813 Obesity, class 3: Secondary | ICD-10-CM

## 2024-06-11 DIAGNOSIS — Z6841 Body Mass Index (BMI) 40.0 and over, adult: Secondary | ICD-10-CM

## 2024-06-11 DIAGNOSIS — E662 Morbid (severe) obesity with alveolar hypoventilation: Secondary | ICD-10-CM

## 2024-06-11 MED ORDER — MELOXICAM 15 MG PO TABS: 15.0000 mg | ORAL_TABLET | Freq: Every day | ORAL | 1 refills | Status: DC

## 2024-06-11 NOTE — Progress Notes (Signed)
 William Rose - 58 y.o. male MRN [id number]  Date of birth: 04/08/1966  Subjective No chief complaint on file.   HPI William Rose is a 58 y.o. male here today for follow-up visit.    Reports overall he is doing well.  He is getting in normal and his letter to support use of this as a companion animal.  He is requesting this for emotional support as well as continued support with his weight loss journey.  He is over this to help him feel a little more motivated to exercise on a regular basis as well as support for anxiety about his weight.  Continues to have shoulder pain in the left shoulder.  Well-controlled with taking meloxicam  but returns if he discontinues this.  Denies radiation of pain, numbness or tingling.  No significant weakness.  ROS:  A comprehensive ROS was completed and negative except as noted per HPI   No Known Allergies  Past Medical History:  Diagnosis Date   Headache    Sleep apnea     Past Surgical History:  Procedure Laterality Date   CARPAL TUNNEL RELEASE Bilateral    GASTRIC ROUX-EN-Y N/A 10/25/2022   Procedure: LAPAROSCOPIC ROUX-EN-Y GASTRIC BYPASS WITH UPPER ENDOSCOPY;  Surgeon: Tanda Locus, MD;  Location: [facility];  Service: General;  Laterality: N/A;    Social History   Socioeconomic History   Marital status: Married    Spouse name: Darin   Number of children: 4   Years of education: Not on file   Highest education level: Some college, no degree  Occupational History    Employer: [employer]  Tobacco Use   Smoking status: Former    Current packs/day: 0.00    Average packs/day: 1 pack/day for 30.0 years (30.0 ttl pk-yrs)    Types: Cigarettes    Start date: 08/10/1987    Quit date: 08/09/2017    Years since quitting: 6.8   Smokeless tobacco: Never  Vaping Use   Vaping status: Never Used  Substance and Sexual Activity   Alcohol use: Never   Drug use: Never   Sexual activity: Yes    Partners: Female  Other Topics Concern   Not on  file  Social History Narrative   Lives with wife   R handed   Caffeine: use to drink 16 C of coffee a day and now 8 coffee a day   Social Drivers of Corporate Investment Banker Strain: Low Risk  (06/11/2024)   Overall Financial Resource Strain (CARDIA)    Difficulty of Paying Living Expenses: Not hard at all  Food Insecurity: No Food Insecurity (06/11/2024)   Hunger Vital Sign    Worried About Running Out of Food in the Last Year: Never true    Ran Out of Food in the Last Year: Never true  Transportation Needs: No Transportation Needs (06/11/2024)   PRAPARE - Administrator, Civil Service (Medical): No    Lack of Transportation (Non-Medical): No  Physical Activity: Inactive (06/11/2024)   Exercise Vital Sign    Days of Exercise per Week: 1 day    Minutes of Exercise per Session: 0 min  Stress: Stress Concern Present (06/11/2024)   [employer] of Occupational Health - Occupational Stress Questionnaire    Feeling of Stress: Very much  Social Connections: Moderately Isolated (06/11/2024)   Social Connection and Isolation Panel    Frequency of Communication with Friends and Family: More than three times a week    Frequency  of Social Gatherings with Friends and Family: Once a week    Attends Religious Services: Never    Database Administrator or Organizations: No    Attends Engineer, Structural: Not on file    Marital Status: Married    Family History  Problem Relation Age of Onset   Epilepsy Mother     Health Maintenance  Topic Date Due   HIV Screening  Never done   Hepatitis C Screening  Never done   Hepatitis B Vaccines 19-59 Average Risk (1 of 3 - 19+ 3-dose series) Never done   Lung Cancer Screening  Never done   Pneumococcal Vaccine: 50+ Years (1 of 1 - PCV) Never done   Zoster Vaccines- Shingrix (1 of 2) 07/30/2024 (Originally 01/04/2017)   Influenza Vaccine  11/06/2024 (Originally 03/09/2024)   COVID-19 Vaccine (3 - 2025-26 season) 05/09/2025  (Originally 04/09/2024)   Colonoscopy  08/09/2026   DTaP/Tdap/Td (2 - Td or Tdap) 10/11/2030   HPV VACCINES  Aged Out   Meningococcal B Vaccine  Aged Out     ----------------------------------------------------------------------------------------------------------------------------------------------------------------------------------------------------------------- Physical Exam There were no vitals taken for this visit.  Physical Exam Constitutional:      Appearance: Normal appearance.  HENT:     Head: Normocephalic and atraumatic.  Eyes:     General: No scleral icterus. Cardiovascular:     Rate and Rhythm: Normal rate and regular rhythm.  Pulmonary:     Effort: Pulmonary effort is normal.     Breath sounds: Normal breath sounds.  Musculoskeletal:     Cervical back: Neck supple.  Neurological:     Mental Status: He is alert.  Psychiatric:        Mood and Affect: Mood normal.        Behavior: Behavior normal.     ------------------------------------------------------------------------------------------------------------------------------------------------------------------------------------------------------------------- Assessment and Plan  Class 3 obesity with alveolar hypoventilation without serious comorbidity with body mass index (BMI) of 45.0 to 49.9 in adult [facility]) He is in fairly well with his weight loss surgery.  Has some anxiety about gaining weight back.  Letter written for support animal to the help with continued exercise support of his anxiety.  Left shoulder pain Improved with meloxicam  but has not fully resolved.  Will get him in with sports medicine to evaluate shoulder   Meds ordered this encounter  Medications   meloxicam  (MOBIC ) 15 MG tablet    Sig: Take 1 tablet (15 mg total) by mouth daily.    Dispense:  30 tablet    Refill:  1    No follow-ups on file.

## 2024-06-11 NOTE — Assessment & Plan Note (Signed)
 He is in fairly well with his weight loss surgery.  Has some anxiety about gaining weight back.  Letter written for support animal to the help with continued exercise support of his anxiety.

## 2024-06-11 NOTE — Assessment & Plan Note (Signed)
 Improved with meloxicam  but has not fully resolved.  Will get him in with sports medicine to evaluate shoulder

## 2024-06-15 ENCOUNTER — Ambulatory Visit (HOSPITAL_BASED_OUTPATIENT_CLINIC_OR_DEPARTMENT_OTHER)
Admission: RE | Admit: 2024-06-15 | Discharge: 2024-06-15 | Disposition: A | Discharge status: Home / Self Care | Source: Ambulatory Visit

## 2024-06-15 ENCOUNTER — Ambulatory Visit

## 2024-06-15 VITALS — BP 110/80 | Ht 67.0 in | Wt 214.0 lb

## 2024-06-15 DIAGNOSIS — S46012A Strain of muscle(s) and tendon(s) of the rotator cuff of left shoulder, initial encounter: Secondary | ICD-10-CM | POA: Diagnosis not present

## 2024-06-15 DIAGNOSIS — M25512 Pain in left shoulder: Secondary | ICD-10-CM | POA: Diagnosis not present

## 2024-06-15 DIAGNOSIS — S46212A Strain of muscle, fascia and tendon of other parts of biceps, left arm, initial encounter: Secondary | ICD-10-CM

## 2024-06-15 NOTE — Progress Notes (Signed)
   Subjective:    Patient ID: William Rose, male    DOB: 58 y.o., [birth year]   MRN: [id number]  Chief Complaint: Left shoulder pain  Discussed the use of AI scribe software for clinical note transcription with the patient, who gave verbal consent to proceed.  History of Present Illness William Rose is a 58 year old right hand dominant male who presents with left shoulder pain. He was previously seen by Dr. Alvia for this issue.  Left shoulder pain - Persistent pain for several months following lifting a metal bar at work - Initial injury accompanied by a popping sensation - Pain worsens with physical activity - Pain interferes with ability to sleep on the left side - Partial relief with oral medication - Associated with clicking sensation in the shoulder  Functional limitations and rehabilitation - Difficulty sleeping on the left side due to pain - Performs exercises with an elastic band  Shoulder injury history - No previous surgeries or major injuries to the left shoulder - No history of shoulder dislocations - Right-hand dominant   Objective:   Vitals:   06/15/24 1106  BP: 110/80   Left Shoulder ( compared to normal ) Inspection: - swelling, - scapular dyskinesis Palpation: TTP - greater tuberosity, - AC joint, + biceps tendon, - posterior shoulder AROM/PROM: 170 forward flexion, 170 abduction, 60 external rotation, internal rotation to lumbar spine Strength: 5-/5 internal rotation, 5/5 lift off, 5/5 empty can, 5/5 external rotation, 5/5 flexion, - drop arm test Special tests:    -Rotator Cuff: Equivocal Neer's, + Hawkin's, - empty can, + painful arc   -Labrum: - O'brien's, - Jerk   -Biceps: + speed's, + yergason's, - Popeye sign   -AC Joint: + cross arm testing     -Instability: - external rotation/apprehension/relocation test, - sulcus sign     Assessment & Plan:   Assessment & Plan Left partial subscapularis tear / biceps tendon partial  tear Chronic left shoulder pain has persisted for several months, worsened by lifting a metal bar at work and feeling a pop. The pain is severe, impacting sleep and daily activities. Examination shows pain with specific movements, indicating possible rotator cuff or biceps tendon involvement. Differential diagnosis at this point favors partial-thickness rotator cuff tear (likely in his subscapularis) and/or biceps partial tearing. Ordered x-rays of the left shoulder to assess for bony pathology. Continue home exercises astutely provided by PCP, Dr. Alvia.  Transition to meloxicam  from prednisone  for pain management. A follow-up visit is scheduled for a shoulder ultrasound to be done by myself to more fully assess his cuff and biceps tendon.  If pain worsening/persisting consider stronger anti-inflammatory or subacromial injection.

## 2024-06-19 DIAGNOSIS — K912 Postsurgical malabsorption, not elsewhere classified: Secondary | ICD-10-CM | POA: Diagnosis not present

## 2024-06-19 DIAGNOSIS — E568 Deficiency of other vitamins: Secondary | ICD-10-CM | POA: Diagnosis not present

## 2024-06-19 DIAGNOSIS — Z1321 Encounter for screening for nutritional disorder: Secondary | ICD-10-CM | POA: Diagnosis not present

## 2024-06-21 ENCOUNTER — Other Ambulatory Visit: Payer: Self-pay

## 2024-06-21 ENCOUNTER — Ambulatory Visit

## 2024-06-21 ENCOUNTER — Ambulatory Visit: Payer: Self-pay

## 2024-06-21 VITALS — BP 104/80 | Ht 67.0 in | Wt 214.0 lb

## 2024-06-21 DIAGNOSIS — M25512 Pain in left shoulder: Secondary | ICD-10-CM

## 2024-06-21 DIAGNOSIS — M75102 Unspecified rotator cuff tear or rupture of left shoulder, not specified as traumatic: Secondary | ICD-10-CM | POA: Diagnosis not present

## 2024-06-21 DIAGNOSIS — S46012A Strain of muscle(s) and tendon(s) of the rotator cuff of left shoulder, initial encounter: Secondary | ICD-10-CM

## 2024-06-21 DIAGNOSIS — S46212A Strain of muscle, fascia and tendon of other parts of biceps, left arm, initial encounter: Secondary | ICD-10-CM | POA: Diagnosis not present

## 2024-06-21 MED ORDER — DICLOFENAC SODIUM 75 MG PO TBEC: 75.0000 mg | DELAYED_RELEASE_TABLET | Freq: Two times a day (BID) | ORAL | 1 refills | Status: DC

## 2024-06-21 NOTE — Progress Notes (Signed)
   Subjective:    Patient ID: William Rose, male    DOB: 58 y.o., 04/26/1966   MRN: [id number]  Chief Complaint: Left shoulder ultrasound  Discussed the use of AI scribe software for clinical note transcription with the patient, who gave verbal consent to proceed.  History of Present Illness  Patient reports pain has continued since his last visit with myself.    Objective:   Vitals:   06/21/24 0949  BP: 104/80   Complete US  Examination of the Left Shoulder: -Biceps: The long head of the biceps is well-visualized in both long and short axis and appears to have longitudinal split tearing and short axis and a small hypoechogenic fluid pocket present deep to the tendon along the region of the myotendinous junction. - Subscapularis: Subscapularis tendon is well-visualized and appears to have a calcification present near the footprint.  Heterogeneous echotexture to this -AC joint: The AC joint is well-visualized and appears to have a prominent amount of degenerative changes surrounding it.  Positive geyser sign. - Supraspinatus appears to have a partial-thickness bursal sided tear through approximately 25% of the tendon adjacent to the footprint. - Mild to moderate enlargement of the subacromial/subdeltoid bursa hypoechogenic fluid signal present within. -Infraspinatus is well-visualized and appears to have a mild to moderate degree of heterogeneous echotexture - Teres minor is well-visualized and appears normal. - Limited evaluation of the posterior joint appears to yield a small hypoechogenic cystic-appearing structure adjacent to the superior/posterior labrum.  No significant effusion of the glenohumeral joint.  Impression: - Biceps tendon longitudinal split tearing and tendinosis - Subscapularis calcific tendinosis - AC joint arthritis - Partial-thickness bursal sided supraspinatus tear - Infraspinatus tendinosis - Potential supero-posterior paralabral cyst  Ultrasound  examination performed and interpreted by Redell Robes, DO     Assessment & Plan:   Assessment & Plan William Rose has numerous findings on his ultrasound of his shoulder today which support the unfortunate level of debility he has been experiencing. He Will finish course of meloxicam  from Dr. Alvia because this thus far has been providing good pain relief for him.  Will initiate anti-inflammatory prescribed by myself thereafter.  Per his preference, he will schedule visit for shoulder injection immediately prior to his vacation in mid-December.  Initiating physical therapy now.

## 2024-07-04 ENCOUNTER — Emergency Department (HOSPITAL_COMMUNITY): Admission: EM | Admit: 2024-07-04 | Discharge: 2024-07-04 | Disposition: A | Discharge status: Home / Self Care

## 2024-07-04 ENCOUNTER — Emergency Department (HOSPITAL_COMMUNITY)

## 2024-07-04 ENCOUNTER — Other Ambulatory Visit: Payer: Self-pay

## 2024-07-04 ENCOUNTER — Encounter (HOSPITAL_COMMUNITY): Payer: Self-pay

## 2024-07-04 DIAGNOSIS — R160 Hepatomegaly, not elsewhere classified: Secondary | ICD-10-CM | POA: Diagnosis not present

## 2024-07-04 DIAGNOSIS — R140 Abdominal distension (gaseous): Secondary | ICD-10-CM | POA: Diagnosis not present

## 2024-07-04 DIAGNOSIS — R1013 Epigastric pain: Secondary | ICD-10-CM | POA: Insufficient documentation

## 2024-07-04 DIAGNOSIS — K8020 Calculus of gallbladder without cholecystitis without obstruction: Secondary | ICD-10-CM | POA: Diagnosis not present

## 2024-07-04 DIAGNOSIS — R1084 Generalized abdominal pain: Secondary | ICD-10-CM | POA: Diagnosis not present

## 2024-07-04 LAB — COMPREHENSIVE METABOLIC PANEL WITH GFR
ALT: 16 U/L (ref 0–44)
AST: 23 U/L (ref 15–41)
Albumin: 4.2 g/dL (ref 3.5–5.0)
Alkaline Phosphatase: 53 U/L (ref 38–126)
Anion gap: 8 (ref 5–15)
BUN: 20 mg/dL (ref 6–20)
CO2: 27 mmol/L (ref 22–32)
Calcium: 8.9 mg/dL (ref 8.9–10.3)
Chloride: 106 mmol/L (ref 98–111)
Creatinine, Ser: 0.6 mg/dL — ABNORMAL LOW (ref 0.61–1.24)
GFR, Estimated: >60 mL/min (ref >60)
Glucose, Bld: 85 mg/dL (ref 70–99)
Potassium: 3.9 mmol/L (ref 3.5–5.1)
Sodium: 142 mmol/L (ref 135–145)
Total Bilirubin: 0.5 mg/dL (ref 0.0–1.2)
Total Protein: 6.4 g/dL — ABNORMAL LOW (ref 6.5–8.1)

## 2024-07-04 LAB — URINALYSIS, ROUTINE W REFLEX MICROSCOPIC
Bilirubin Urine: NEGATIVE
Glucose, UA: NEGATIVE mg/dL
Hgb urine dipstick: NEGATIVE
Ketones, ur: NEGATIVE mg/dL
Leukocytes,Ua: NEGATIVE
Nitrite: NEGATIVE
Protein, ur: NEGATIVE mg/dL
Specific Gravity, Urine: 1.038 — ABNORMAL HIGH (ref 1.005–1.030)
pH: 6 (ref 5.0–8.0)

## 2024-07-04 LAB — CBC
HCT: 37.2 % — ABNORMAL LOW (ref 39.0–52.0)
Hemoglobin: 12.8 g/dL — ABNORMAL LOW (ref 13.0–17.0)
MCH: 31.2 pg (ref 26.0–34.0)
MCHC: 34.4 g/dL (ref 30.0–36.0)
MCV: 90.7 fL (ref 80.0–100.0)
Platelets: 328 K/uL (ref 150–400)
RBC: 4.1 MIL/uL — ABNORMAL LOW (ref 4.22–5.81)
RDW: 12.8 % (ref 11.5–15.5)
WBC: 8.8 K/uL (ref 4.0–10.5)
nRBC: 0 % (ref 0.0–0.2)

## 2024-07-04 LAB — I-STAT CG4 LACTIC ACID, ED: Lactic Acid, Venous: 0.5 mmol/L (ref 0.5–1.9)

## 2024-07-04 LAB — LIPASE, BLOOD: Lipase: 21 U/L (ref 11–51)

## 2024-07-04 MED ORDER — IOHEXOL 300 MG/ML  SOLN
100.0000 mL | Freq: Once | INTRAMUSCULAR | Status: AC | PRN
  Administered 2024-07-04: 100 mL via INTRAVENOUS

## 2024-07-04 MED ORDER — SUCRALFATE 1 G PO TABS
1.0000 g | ORAL_TABLET | Freq: Once | ORAL | Status: AC
  Administered 2024-07-04: 1 g via ORAL
  Filled 2024-07-04: qty 1

## 2024-07-04 MED ORDER — SUCRALFATE 1 G PO TABS: 1.0000 g | ORAL_TABLET | Freq: Three times a day (TID) | ORAL | 0 refills | Status: AC

## 2024-07-04 MED ORDER — ONDANSETRON HCL 4 MG/2ML IJ SOLN
4.0000 mg | Freq: Once | INTRAMUSCULAR | Status: DC
  Filled 2024-07-04: qty 2

## 2024-07-04 MED ORDER — LACTATED RINGERS IV BOLUS
1000.0000 mL | Freq: Once | INTRAVENOUS | Status: AC
  Administered 2024-07-04: 1000 mL via INTRAVENOUS

## 2024-07-04 MED ORDER — PANTOPRAZOLE SODIUM 20 MG PO TBEC: 20.0000 mg | DELAYED_RELEASE_TABLET | Freq: Every day | ORAL | 0 refills | Status: AC

## 2024-07-04 MED ORDER — ALUM & MAG HYDROXIDE-SIMETH 200-200-20 MG/5ML PO SUSP
30.0000 mL | Freq: Once | ORAL | Status: AC
  Administered 2024-07-04: 30 mL via ORAL
  Filled 2024-07-04: qty 30

## 2024-07-04 MED ORDER — MORPHINE SULFATE (PF) 2 MG/ML IV SOLN
2.0000 mg | Freq: Once | INTRAVENOUS | Status: DC
  Filled 2024-07-04: qty 1

## 2024-07-04 NOTE — Discharge Instructions (Addendum)
 Please follow-up with your primary doctor.  He may try the medications we are prescribing you for symptomatic relief.  He have no dietary restrictions, but you may find less symptoms with soft, bland food.  Return for fevers, chills, severe pain, uncontrolled nausea vomiting, stop having bowel movements or any new or worsening symptoms that are concerning to you.

## 2024-07-04 NOTE — ED Triage Notes (Addendum)
 ipad interpreter used, pt speaks Italian and Spanish.   PT ambulatory to triage with complaints of generalized abdominal pain that began Saturday night and has persisted. Pt states that he feels bloated, and that his stomach is making more noise than normal. Denies diarrhea, denies vomiting. PT reports last bowel movement on Monday.

## 2024-07-04 NOTE — ED Provider Notes (Signed)
 [place] EMERGENCY DEPARTMENT AT [facility] Provider Note   CSN: [id number] Arrival date & time: 07/04/24  1125     Patient presents with: Abdominal Pain   William Rose is a 58 y.o. male.   This is a 58 year old male history of gastric bypass.  Contents to the emergency department with epigastric abdominal pain.  Symptoms started Saturday night and have been constant since.  Nonradiating.  Described as sharp.  Worsened with food.  No nausea vomiting.  Last bowel movement Monday.  No other abdominal surgeries.  No dark tarry stools or blood in the stool.  No sick contacts.   Abdominal Pain      Prior to Admission medications   Medication Sig Start Date End Date Taking? Authorizing Provider  pantoprazole  (PROTONIX ) 20 MG tablet Take 1 tablet (20 mg total) by mouth daily for 14 days. 07/04/24 07/18/24 Yes Donathan Buller, Caron PARAS, DO  sucralfate  (CARAFATE ) 1 g tablet Take 1 tablet (1 g total) by mouth in the morning, at noon, and at bedtime for 14 days. 07/04/24 07/18/24 Yes Roxi Hlavaty, Caron PARAS, DO  diclofenac  (VOLTAREN ) 75 MG EC tablet Take 1 tablet (75 mg total) by mouth 2 (two) times daily. 06/21/24   Gottwalt, Redell A, DO  lidocaine  (LIDODERM ) 5 % Place 1 patch onto the skin daily. Remove & Discard patch within 12 hours or as directed by MD 08/26/23   Alvia Bring, DO  meloxicam  (MOBIC ) 15 MG tablet Take 1 tablet (15 mg total) by mouth daily. 06/11/24   Alvia Bring, DO  pregabalin  (LYRICA ) 75 MG capsule Take 1 capsule (75 mg total) by mouth 2 (two) times daily. 08/26/23   Alvia Bring, DO  scopolamine  (TRANSDERM-SCOP) 1 MG/3DAYS Place 1 patch (1 mg total) onto the skin every 3 (three) days. For motion sickness 04/23/24   Alvia Bring, DO  triamcinolone  cream (KENALOG ) 0.1 % Apply 1 Application topically 2 (two) times daily. 06/08/24   Alvia Bring, DO    Allergies: Patient has no known allergies.    Review of Systems  Gastrointestinal:  Positive for abdominal  pain.    Updated Vital Signs BP (!) 133/94   Pulse 63   Temp 98.3 F (36.8 C) (Oral)   Resp 13   SpO2 98%   Physical Exam Vitals and nursing note reviewed.  Constitutional:      General: He is not in acute distress.    Appearance: He is not toxic-appearing.  HENT:     Head: Normocephalic.  Cardiovascular:     Rate and Rhythm: Normal rate and regular rhythm.  Pulmonary:     Effort: Pulmonary effort is normal.     Breath sounds: Normal breath sounds.  Abdominal:     General: Abdomen is flat.     Palpations: Abdomen is soft.     Tenderness: There is abdominal tenderness in the epigastric area. There is no guarding or rebound.     Hernia: No hernia is present.  Skin:    General: Skin is warm and dry.     Capillary Refill: Capillary refill takes less than 2 seconds.  Neurological:     Mental Status: He is alert and oriented to person, place, and time.  Psychiatric:        Mood and Affect: Mood normal.        Behavior: Behavior normal.     (all labs ordered are listed, but only abnormal results are displayed) Labs Reviewed  COMPREHENSIVE METABOLIC PANEL WITH GFR - Abnormal;  Notable for the following components:      Result Value   Creatinine, Ser 0.60 (*)    Total Protein 6.4 (*)    All other components within normal limits  CBC - Abnormal; Notable for the following components:   RBC 4.10 (*)    Hemoglobin 12.8 (*)    HCT 37.2 (*)    All other components within normal limits  URINALYSIS, ROUTINE W REFLEX MICROSCOPIC - Abnormal; Notable for the following components:   Specific Gravity, Urine 1.038 (*)    All other components within normal limits  LIPASE, BLOOD  I-STAT CG4 LACTIC ACID, ED    EKG: EKG Interpretation Date/Time:  Wednesday July 04 2024 12:55:57 EST Ventricular Rate:  64 PR Interval:  143 QRS Duration:  96 QT Interval:  411 QTC Calculation: 424 R Axis:   64  Text Interpretation: Sinus rhythm Confirmed by Neysa Clap [phone]) on 07/04/2024  2:17:07 PM  Radiology: CT ABDOMEN PELVIS W CONTRAST Result Date: 07/04/2024 CLINICAL DATA:  Generalized abdominal pain, bloating. EXAM: CT ABDOMEN AND PELVIS WITH CONTRAST TECHNIQUE: Multidetector CT imaging of the abdomen and pelvis was performed using the standard protocol following bolus administration of intravenous contrast. RADIATION DOSE REDUCTION: This exam was performed according to the departmental dose-optimization program which includes automated exposure control, adjustment of the mA and/or kV according to patient size and/or use of iterative reconstruction technique. CONTRAST:  OMNIPAQUE  IOHEXOL  300 MG/ML  SOLN COMPARISON:  None Available. FINDINGS: Lower chest: Mild dependent atelectasis bilaterally. Heart size normal. No pericardial effusion. No pleural effusion. Distal esophagus is grossly unremarkable. Hepatobiliary: Liver is enlarged, 20.2 cm, but otherwise grossly unremarkable. Large gallstones. No biliary ductal dilatation. Pancreas: Negative. Spleen: Negative. Adrenals/Urinary Tract: Adrenal glands and right kidney are unremarkable. Small low-attenuation lesion in the left kidney. No specific follow-up necessary. Ureters are decompressed. Bladder is grossly unremarkable. Stomach/Bowel: Gastric bypass. Stomach, small bowel, appendix and colon are otherwise unremarkable. Vascular/Lymphatic: Atherosclerotic calcification of the aorta. No pathologically enlarged lymph nodes. Reproductive: Prostate is normal in size. Other: No free fluid.  Mesenteries and peritoneum are unremarkable. Musculoskeletal: Degenerative changes in the spine. Minimal retrolisthesis of L5 on S1. IMPRESSION: 1. No acute findings. 2. Hepatomegaly. 3. Cholelithiasis. 4.  Aortic atherosclerosis (ICD10-I70.0). Electronically Signed   By: Newell Eke M.D.   On: 07/04/2024 14:11     Procedures   Medications Ordered in the ED  ondansetron  (ZOFRAN ) injection 4 mg (4 mg Intravenous Patient Refused/Not Given  07/04/24 1200)  lactated ringers  bolus 1,000 mL (0 mLs Intravenous Stopped 07/04/24 1342)  iohexol  (OMNIPAQUE ) 300 MG/ML solution 100 mL (100 mLs Intravenous Contrast Given 07/04/24 1305)  alum & mag hydroxide-simeth (MAALOX/MYLANTA) 200-200-20 MG/5ML suspension 30 mL (30 mLs Oral Given 07/04/24 1422)  sucralfate  (CARAFATE ) tablet 1 g (1 g Oral Given 07/04/24 1422)    Clinical Course as of 07/04/24 1428  Wed Jul 04, 2024  1416 CT ABDOMEN PELVIS W CONTRAST IMPRESSION: 1. No acute findings. 2. Hepatomegaly. 3. Cholelithiasis. 4.  Aortic atherosclerosis (ICD10-I70.0).   Electronically Signed   By: Newell Eke M.D.   On: 07/04/2024 14:11   [TY]  1416 Lipase: 21 [TY]  1416 Comprehensive metabolic panel(!) No transaminitis or elevated bilirubin to suggest acute hepatobiliary disease. [TY]  1416 CBC(!) No leukocytosis to suggest systemic infection/sepsis.  Stable anemia [TY]  1416 Urinalysis, Routine w reflex microscopic -Urine, Clean Catch(!) Not consistent with UTI. [TY]  1416 Lactic Acid, Venous: 0.5 Bowel ischemia unlikely [TY]  1425 Imaging lab workup  reassuring.  Will discharge in stable condition as I do not identify an emergent cause for his condition.  Discussed follow-up with his PCP.  Will trial Protonix  and sucrafate for possible ulcer/gastritis.  [TY]    Clinical Course User Index [TY] Neysa Caron PARAS, DO                                 Medical Decision Making This is a 58 year old male presenting emergency department with abdominal pain.  Primarily Spanish-speaking; history obtained with the aid of video interpreter.  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  Does have some epigastric tenderness on exam.  Vitals otherwise reassuring.  Will get screening labs and CT scan to evaluate for obstructive process.  Will treat with morphine  and IV fluids and Zofran .  Plan to reevaluate.  See ED course for further MDM disposition.  Amount and/or Complexity of  Data Reviewed External Data Reviewed:     Details: Do not see prior CT abdomen imaging patient records Labs: ordered. Decision-making details documented in ED Course. Radiology: ordered and independent interpretation performed. Decision-making details documented in ED Course. ECG/medicine tests: ordered and independent interpretation performed. Decision-making details documented in ED Course.  Risk OTC drugs. Prescription drug management.       Final diagnoses:  Epigastric pain    ED Discharge Orders          Ordered    pantoprazole  (PROTONIX ) 20 MG tablet  Daily        07/04/24 1427    sucralfate  (CARAFATE ) 1 g tablet  3 times daily        07/04/24 1427               Neysa Caron PARAS, DO 07/04/24 1428

## 2024-07-19 ENCOUNTER — Other Ambulatory Visit: Payer: Self-pay

## 2024-07-19 DIAGNOSIS — S46212A Strain of muscle, fascia and tendon of other parts of biceps, left arm, initial encounter: Secondary | ICD-10-CM

## 2024-07-19 DIAGNOSIS — S46012A Strain of muscle(s) and tendon(s) of the rotator cuff of left shoulder, initial encounter: Secondary | ICD-10-CM

## 2024-07-19 DIAGNOSIS — M25512 Pain in left shoulder: Secondary | ICD-10-CM

## 2024-07-19 DIAGNOSIS — M75102 Unspecified rotator cuff tear or rupture of left shoulder, not specified as traumatic: Secondary | ICD-10-CM

## 2024-07-24 NOTE — Progress Notes (Unsigned)
° °  Subjective:    Patient ID: Dashiel Bergquist, male    DOB: 58 y.o., [birth year]   MRN: [id number]  Chief Complaint: Left supraspinatus partial tear, left biceps tendon partial tear (1 month follow-up)  Discussed the use of AI scribe software for clinical note transcription with the patient, who gave verbal consent to proceed.  History of Present Illness Elvan Ebron is a pleasant 58 year old male previously diagnosed by myself on 06/21/2024 with a partial-thickness tear of the supraspinatus, longitudinal tearing of his biceps tendon, and a potential paralabral cyst.  We initiated anti-inflammatory therapy and plan for subacromial injection today prior to a planned vacation he has.     Objective:   Vitals:   07/25/24 0810  BP: 128/80    Subacromial Space Injection with Ultrasound Guidance Procedure Note Hershell Brandl Mceuen 04-25-1966 Indications: Pain Procedure Details Verbal consent was obtained from the patient. Risks, benefits, and alternatives were explained. The left subacromial space and subacromial/subdeltoid bursa was identified on US . Patient prepped with Chloraprep and Ethyl Chloride used for anesthesia. The patient was then injected using a lateral approach with a solution of 40mg  Depo-Medrol , 3cc Mepivacaine 2%, 0.5cc Sodium Bicarbonate 8.4%. The patient tolerated the procedure well and had decreased pain post injection. No complications. Please see applicable images in the medical record.     Assessment & Plan:   Assessment & Plan Jorel-Carlos tolerated the procedure today well.  I recommended augmenting this therapy with Voltaren  gel 2-4 times per day.  He does have a Roux-en-Y gastric bypass.  No several population-based cohort studies do show significant increase in risk of peptic ulcers with NSAID use and Roux-en-Y gastric bypass patients, this was associated with continuous NSAID use of greater than 30 days, while temporary use less than 30 days showed no increased  risk.  Additionally, potential risk of ulceration appears to be mitigated further when NSAIDs are coadministered alongside a proton pump inhibitors.  I discussed this with the patient and we will consider doing this in the future if inadequate pain relief is obtained.

## 2024-07-25 ENCOUNTER — Other Ambulatory Visit: Payer: Self-pay

## 2024-07-25 ENCOUNTER — Ambulatory Visit

## 2024-07-25 VITALS — BP 128/80 | Ht 67.0 in | Wt 214.0 lb

## 2024-07-25 DIAGNOSIS — M75102 Unspecified rotator cuff tear or rupture of left shoulder, not specified as traumatic: Secondary | ICD-10-CM

## 2024-07-25 DIAGNOSIS — S46212A Strain of muscle, fascia and tendon of other parts of biceps, left arm, initial encounter: Secondary | ICD-10-CM

## 2024-07-25 MED ORDER — METHYLPREDNISOLONE ACETATE 40 MG/ML IJ SUSP
40.0000 mg | Freq: Once | INTRAMUSCULAR | Status: AC
  Administered 2024-07-25: 09:00:00 40 mg via INTRA_ARTICULAR

## 2024-07-25 NOTE — Addendum Note (Signed)
 Addended by: MARCINE HARLENE SAILOR on: 07/25/2024 08:45 AM   Modules accepted: Orders

## 2024-07-30 ENCOUNTER — Other Ambulatory Visit: Payer: Self-pay

## 2024-07-30 DIAGNOSIS — M75102 Unspecified rotator cuff tear or rupture of left shoulder, not specified as traumatic: Secondary | ICD-10-CM

## 2024-07-30 DIAGNOSIS — S46212A Strain of muscle, fascia and tendon of other parts of biceps, left arm, initial encounter: Secondary | ICD-10-CM

## 2024-07-30 DIAGNOSIS — M25512 Pain in left shoulder: Secondary | ICD-10-CM

## 2024-07-30 DIAGNOSIS — S46012A Strain of muscle(s) and tendon(s) of the rotator cuff of left shoulder, initial encounter: Secondary | ICD-10-CM

## 2024-08-24 ENCOUNTER — Other Ambulatory Visit: Payer: Self-pay

## 2024-08-24 DIAGNOSIS — S46012A Strain of muscle(s) and tendon(s) of the rotator cuff of left shoulder, initial encounter: Secondary | ICD-10-CM

## 2024-08-24 DIAGNOSIS — M75102 Unspecified rotator cuff tear or rupture of left shoulder, not specified as traumatic: Secondary | ICD-10-CM

## 2024-08-24 DIAGNOSIS — M25512 Pain in left shoulder: Secondary | ICD-10-CM

## 2024-08-24 DIAGNOSIS — S46212A Strain of muscle, fascia and tendon of other parts of biceps, left arm, initial encounter: Secondary | ICD-10-CM

## 2024-08-27 ENCOUNTER — Encounter

## 2024-08-27 NOTE — Progress Notes (Unsigned)
 Erroneous encounter due to cancellation or no-show. Patient was not seen.

## 2024-09-04 ENCOUNTER — Encounter: Payer: Self-pay | Admitting: *Deleted

## 2024-09-04 ENCOUNTER — Other Ambulatory Visit: Payer: Self-pay

## 2024-09-04 DIAGNOSIS — M25512 Pain in left shoulder: Secondary | ICD-10-CM

## 2024-09-04 DIAGNOSIS — M75102 Unspecified rotator cuff tear or rupture of left shoulder, not specified as traumatic: Secondary | ICD-10-CM

## 2024-09-04 DIAGNOSIS — S46212A Strain of muscle, fascia and tendon of other parts of biceps, left arm, initial encounter: Secondary | ICD-10-CM

## 2024-09-04 DIAGNOSIS — S46012A Strain of muscle(s) and tendon(s) of the rotator cuff of left shoulder, initial encounter: Secondary | ICD-10-CM
